# Patient Record
Sex: Male | Born: 1970
Health system: Southern US, Community
[De-identification: ages and names within clinical notes are randomized; demographics above are authoritative.]

## PROBLEM LIST (undated history)

## (undated) DIAGNOSIS — G4730 Sleep apnea, unspecified: Secondary | ICD-10-CM

## (undated) DIAGNOSIS — J189 Pneumonia, unspecified organism: Secondary | ICD-10-CM

## (undated) DIAGNOSIS — F1911 Other psychoactive substance abuse, in remission: Secondary | ICD-10-CM

## (undated) DIAGNOSIS — E785 Hyperlipidemia, unspecified: Secondary | ICD-10-CM

## (undated) DIAGNOSIS — F603 Borderline personality disorder: Secondary | ICD-10-CM

## (undated) DIAGNOSIS — I10 Essential (primary) hypertension: Secondary | ICD-10-CM

## (undated) DIAGNOSIS — E119 Type 2 diabetes mellitus without complications: Secondary | ICD-10-CM

## (undated) DIAGNOSIS — K219 Gastro-esophageal reflux disease without esophagitis: Secondary | ICD-10-CM

## (undated) DIAGNOSIS — E1165 Type 2 diabetes mellitus with hyperglycemia: Secondary | ICD-10-CM

## (undated) DIAGNOSIS — M1990 Unspecified osteoarthritis, unspecified site: Secondary | ICD-10-CM

## (undated) DIAGNOSIS — F319 Bipolar disorder, unspecified: Secondary | ICD-10-CM

## (undated) HISTORY — DX: Sleep apnea, unspecified: G47.30

## (undated) HISTORY — PX: WISDOM TOOTH EXTRACTION: SHX21

## (undated) HISTORY — DX: Bipolar disorder, unspecified: F31.9

## (undated) HISTORY — DX: Hyperlipidemia, unspecified: E78.5

## (undated) HISTORY — DX: Unspecified osteoarthritis, unspecified site: M19.90

---

## 2019-03-05 ENCOUNTER — Emergency Department (HOSPITAL_COMMUNITY)
Admission: EM | Admit: 2019-03-05 | Discharge: 2019-03-05 | Disposition: A | Discharge status: Home / Self Care | Payer: Medicare Other | Attending: Emergency Medicine | Admitting: Emergency Medicine

## 2019-03-05 ENCOUNTER — Other Ambulatory Visit: Payer: Self-pay

## 2019-03-05 ENCOUNTER — Encounter (HOSPITAL_COMMUNITY): Payer: Self-pay | Admitting: Emergency Medicine

## 2019-03-05 DIAGNOSIS — Z79899 Other long term (current) drug therapy: Secondary | ICD-10-CM | POA: Diagnosis not present

## 2019-03-05 DIAGNOSIS — I10 Essential (primary) hypertension: Secondary | ICD-10-CM | POA: Insufficient documentation

## 2019-03-05 DIAGNOSIS — E0865 Diabetes mellitus due to underlying condition with hyperglycemia: Secondary | ICD-10-CM

## 2019-03-05 DIAGNOSIS — F17210 Nicotine dependence, cigarettes, uncomplicated: Secondary | ICD-10-CM | POA: Insufficient documentation

## 2019-03-05 DIAGNOSIS — Z794 Long term (current) use of insulin: Secondary | ICD-10-CM

## 2019-03-05 DIAGNOSIS — F331 Major depressive disorder, recurrent, moderate: Secondary | ICD-10-CM | POA: Insufficient documentation

## 2019-03-05 DIAGNOSIS — E119 Type 2 diabetes mellitus without complications: Secondary | ICD-10-CM | POA: Diagnosis not present

## 2019-03-05 DIAGNOSIS — R45851 Suicidal ideations: Secondary | ICD-10-CM

## 2019-03-05 DIAGNOSIS — R4689 Other symptoms and signs involving appearance and behavior: Secondary | ICD-10-CM | POA: Diagnosis present

## 2019-03-05 DIAGNOSIS — F321 Major depressive disorder, single episode, moderate: Secondary | ICD-10-CM

## 2019-03-05 HISTORY — DX: Type 2 diabetes mellitus without complications: E11.9

## 2019-03-05 HISTORY — DX: Essential (primary) hypertension: I10

## 2019-03-05 LAB — RAPID URINE DRUG SCREEN, HOSP PERFORMED
Amphetamines: NOT DETECTED
Barbiturates: NOT DETECTED
Benzodiazepines: NOT DETECTED
Cocaine: NOT DETECTED
Opiates: NOT DETECTED
Tetrahydrocannabinol: NOT DETECTED

## 2019-03-05 LAB — CBC WITH DIFFERENTIAL/PLATELET
Abs Immature Granulocytes: 0.02 10*3/uL (ref 0.00–0.07)
Basophils Absolute: 0.1 10*3/uL (ref 0.0–0.1)
Basophils Relative: 1 %
Eosinophils Absolute: 0.2 10*3/uL (ref 0.0–0.5)
Eosinophils Relative: 2 %
HCT: 46.9 % (ref 39.0–52.0)
Hemoglobin: 15.7 g/dL (ref 13.0–17.0)
Immature Granulocytes: 0 %
Lymphocytes Relative: 35 %
Lymphs Abs: 3.1 10*3/uL (ref 0.7–4.0)
MCH: 30.5 pg (ref 26.0–34.0)
MCHC: 33.5 g/dL (ref 30.0–36.0)
MCV: 91.1 fL (ref 80.0–100.0)
Monocytes Absolute: 0.6 10*3/uL (ref 0.1–1.0)
Monocytes Relative: 7 %
Neutro Abs: 4.9 10*3/uL (ref 1.7–7.7)
Neutrophils Relative %: 55 %
Platelets: 181 10*3/uL (ref 150–400)
RBC: 5.15 MIL/uL (ref 4.22–5.81)
RDW: 12.1 % (ref 11.5–15.5)
WBC: 8.9 10*3/uL (ref 4.0–10.5)
nRBC: 0 % (ref 0.0–0.2)

## 2019-03-05 LAB — COMPREHENSIVE METABOLIC PANEL
ALT: 44 U/L (ref 0–44)
AST: 25 U/L (ref 15–41)
Albumin: 3.7 g/dL (ref 3.5–5.0)
Alkaline Phosphatase: 51 U/L (ref 38–126)
Anion gap: 9 (ref 5–15)
BUN: 16 mg/dL (ref 6–20)
CO2: 24 mmol/L (ref 22–32)
Calcium: 9.2 mg/dL (ref 8.9–10.3)
Chloride: 104 mmol/L (ref 98–111)
Creatinine, Ser: 0.6 mg/dL — ABNORMAL LOW (ref 0.61–1.24)
GFR calc Af Amer: >60 mL/min (ref >60)
GFR calc non Af Amer: >60 mL/min (ref >60)
Glucose, Bld: 424 mg/dL — ABNORMAL HIGH (ref 70–99)
Potassium: 4.4 mmol/L (ref 3.5–5.1)
Sodium: 137 mmol/L (ref 135–145)
Total Bilirubin: 0.4 mg/dL (ref 0.3–1.2)
Total Protein: 6.6 g/dL (ref 6.5–8.1)

## 2019-03-05 LAB — ETHANOL: Alcohol, Ethyl (B): <10 mg/dL (ref <10)

## 2019-03-05 LAB — ACETAMINOPHEN LEVEL: Acetaminophen (Tylenol), Serum: <10 ug/mL — ABNORMAL LOW (ref 10–30)

## 2019-03-05 LAB — SALICYLATE LEVEL: Salicylate Lvl: <7 mg/dL (ref 2.8–30.0)

## 2019-03-05 NOTE — ED Provider Notes (Signed)
[facility] EMERGENCY DEPARTMENT Provider Note   CSN: [id number] Arrival date & time: 03/05/19  8546    History   Chief Complaint Chief Complaint  Patient presents with  . V70.1    HPI Aaron Braun is a 48 y.o. male.     Patient presents with worsening aggression and suicidal ideation for the past 2 weeks.  Gradually worsening.  Not one specific trigger.  Patient has attempted suicide twice years ago.  [facility] has been [facility] however had a poor experience there.  Patient has not seen a behavioral health/psychiatrist in over 1 year.  Patient is not currently on medications however is been on multiple medications in the past.  Patient has high blood pressure history.  No fevers chills or other medical symptoms.  Patient has bipolar diagnosis in the past.     Past Medical History:  Diagnosis Date  . Diabetes mellitus without complication (HCC)   . Hypertension     There are no active problems to display for this patient.   History reviewed. No pertinent surgical history.      Home Medications    Prior to Admission medications   Not on File    Family History No family history on file.  Social History Social History   Tobacco Use  . Smoking status: Current Every Day Smoker    Packs/day: 1.00    Types: Cigarettes  . Smokeless tobacco: Never Used  Substance Use Topics  . Alcohol use: Never    Frequency: Never  . Drug use: Never     Allergies   Patient has no allergy information on record.   Review of Systems Review of Systems  Constitutional: Negative for chills and fever.  HENT: Negative for congestion.   Eyes: Negative for visual disturbance.  Respiratory: Negative for shortness of breath.   Cardiovascular: Negative for chest pain.  Gastrointestinal: Negative for abdominal pain and vomiting.  Genitourinary: Negative for dysuria and flank pain.  Musculoskeletal: Negative for back pain, neck pain and neck stiffness.  Skin: Negative for  rash.  Neurological: Negative for light-headedness and headaches.  Psychiatric/Behavioral: Positive for agitation, dysphoric mood and suicidal ideas.     Physical Exam Updated Vital Signs BP (!) 129/94 (BP Location: Left Arm)   Pulse (!) 101   Temp 98 F (36.7 C) (Oral)   Resp 19   Wt (!) 154.7 kg   SpO2 98%   Physical Exam Vitals signs and nursing note reviewed.  Constitutional:      Appearance: He is well-developed.  HENT:     Head: Normocephalic and atraumatic.  Eyes:     General:        Right eye: No discharge.        Left eye: No discharge.  Neck:     Musculoskeletal: Normal range of motion.     Trachea: No tracheal deviation.  Cardiovascular:     Rate and Rhythm: Normal rate.  Pulmonary:     Effort: Pulmonary effort is normal.  Abdominal:     General: There is no distension.     Tenderness: There is no guarding.  Skin:    General: Skin is warm.     Findings: No rash.  Neurological:     Mental Status: He is alert and oriented to person, place, and time.  Psychiatric:        Mood and Affect: Mood is depressed. Affect is not tearful.        Behavior: Behavior is not combative.  Thought Content: Thought content includes suicidal ideation. Thought content does not include suicidal plan.        Cognition and Memory: Cognition normal.        Judgment: Judgment is not impulsive.     Comments: cooperative      ED Treatments / Results  Labs (all labs ordered are listed, but only abnormal results are displayed) Labs Reviewed  COMPREHENSIVE METABOLIC PANEL - Abnormal; Notable for the following components:      Result Value   Glucose, Bld 424 (*)    Creatinine, Ser 0.60 (*)    All other components within normal limits  ACETAMINOPHEN LEVEL - Abnormal; Notable for the following components:   Acetaminophen (Tylenol), Serum <10 (*)    All other components within normal limits  ETHANOL  RAPID URINE DRUG SCREEN, HOSP PERFORMED  CBC WITH DIFFERENTIAL/PLATELET   SALICYLATE LEVEL    EKG None  Radiology No results found.  Procedures Procedures (including critical care time)  Medications Ordered in ED Medications - No data to display   Initial Impression / Assessment and Plan / ED Course  I have reviewed the triage vital signs and the nursing notes.  Pertinent labs & imaging results that were available during my care of the patient were reviewed by me and considered in my medical decision making (see chart for details).       Patient with history of depression, bipolar and suicide attempt presents with gradually worsening symptoms over 2 weeks.  Patient medically clear on my examination, blood work pending.  Patient voluntarily would like help to improve his symptoms.  Patient cooperative and stable in the ER.  Behavioral health recommended outpatient follow-up after full assessment.  Outpatient resources given.  Blood work reviewed unremarkable except elevated Glucose, normal gap.  Pt will need close outpatient follow up and medication management.  Discussed with his mother who is frustrated he did not meet inpatient criteria but will help direct him to pcp for glucose management and outpatient behavioral health.    Final Clinical Impressions(s) / ED Diagnoses   Final diagnoses:  Suicidal ideation  Current moderate episode of major depressive disorder, unspecified whether recurrent (HCC)  Diabetes mellitus due to underlying condition with hyperglycemia, with long-term current use of insulin [facility](HCC)    ED Discharge Orders    None       Blane OharaZavitz, Fillmore Bynum, MD 03/05/19 [phone]
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
## 2019-03-05 NOTE — ED Triage Notes (Signed)
Pt states he has been having suicidal thoughts for a couple weeks now. Pt denies plan.

## 2019-03-05 NOTE — Discharge Instructions (Signed)
Follow up as recommended by behavioral health. Return for worsening or persistent thoughts or plan of self harm.

## 2019-03-05 NOTE — BH Assessment (Signed)
Pt provided clinician his mother's name and phone number and gave verbal consent for clinician to contact his mother for collateral. The information gathered in that phone call can be found in the note [facility] Assessment dated and timed 04/29 4:51 AM.  Silvano Bilis, mother: [phone]

## 2019-08-01 LAB — HEMOGLOBIN A1C: Hemoglobin A1C: 11

## 2019-08-26 ENCOUNTER — Ambulatory Visit (INDEPENDENT_AMBULATORY_CARE_PROVIDER_SITE_OTHER): Payer: Medicare Other | Admitting: "Endocrinology

## 2019-08-26 ENCOUNTER — Other Ambulatory Visit: Payer: Self-pay

## 2019-08-26 ENCOUNTER — Encounter: Payer: Self-pay | Admitting: "Endocrinology

## 2019-08-26 VITALS — BP 139/87 | HR 106 | Ht 72.0 in | Wt 331.0 lb

## 2019-08-26 DIAGNOSIS — E782 Mixed hyperlipidemia: Secondary | ICD-10-CM | POA: Diagnosis not present

## 2019-08-26 DIAGNOSIS — E1165 Type 2 diabetes mellitus with hyperglycemia: Secondary | ICD-10-CM

## 2019-08-26 DIAGNOSIS — I10 Essential (primary) hypertension: Secondary | ICD-10-CM | POA: Insufficient documentation

## 2019-08-26 MED ORDER — ACCU-CHEK GUIDE VI STRP: ORAL_STRIP | 2 refills | Status: DC

## 2019-08-26 MED ORDER — SITAGLIPTIN PHOSPHATE 50 MG PO TABS: 50.0000 mg | ORAL_TABLET | Freq: Every day | ORAL | 3 refills | Status: DC

## 2019-08-26 MED ORDER — LEVEMIR FLEXTOUCH 100 UNIT/ML ~~LOC~~ SOPN: 60.0000 [IU] | PEN_INJECTOR | Freq: Every day | SUBCUTANEOUS | 2 refills | Status: DC

## 2019-08-26 MED ORDER — PEN NEEDLES 31G X 8 MM MISC: 1.0000 | Freq: Every day | 0 refills | Status: AC

## 2019-08-26 NOTE — Progress Notes (Signed)
Endocrinology Consult Note       08/26/2019, 5:55 PM   Subjective:    Patient ID: Aaron Braun, male    DOB: 1971-09-22.  Aaron Braun is being seen in consultation for management of currently uncontrolled symptomatic diabetes requested by  Gaspar Skeeters, MD.   Past Medical History:  Diagnosis Date  . Diabetes mellitus without complication (HCC)   . Hypertension     History reviewed. No pertinent surgical history.  Social History   Socioeconomic History  . Marital status: Single    Spouse name: Not on file  . Number of children: Not on file  . Years of education: Not on file  . Highest education level: Not on file  Occupational History  . Not on file  Social Needs  . Financial resource strain: Not on file  . Food insecurity    Worry: Not on file    Inability: Not on file  . Transportation needs    Medical: Not on file    Non-medical: Not on file  Tobacco Use  . Smoking status: Current Every Day Smoker    Packs/day: 1.00    Years: 35.00    Pack years: 35.00    Types: Cigarettes  . Smokeless tobacco: Never Used  Substance and Sexual Activity  . Alcohol use: Never    Frequency: Never  . Drug use: Never  . Sexual activity: Not on file  Lifestyle  . Physical activity    Days per week: Not on file    Minutes per session: Not on file  . Stress: Not on file  Relationships  . Social Musician on phone: Not on file    Gets together: Not on file    Attends religious service: Not on file    Active member of club or organization: Not on file    Attends meetings of clubs or organizations: Not on file    Relationship status: Not on file  Other Topics Concern  . Not on file  Social History Narrative  . Not on file    Family History  Problem Relation Age of Onset  . Diabetes Mother   . Thyroid disease Mother   . Hypertension Mother   . Hyperlipidemia  Mother   . CAD Mother   . Kidney disease Mother   . Stroke Mother   . Heart attack Mother   . Diabetes Father   . Hypertension Father   . Hyperlipidemia Father   . Heart attack Father   . CAD Father     Outpatient Encounter Medications as of 08/26/2019  Medication Sig  . atorvastatin (LIPITOR) 40 MG tablet Take by mouth daily.  Marland Kitchen lisinopril-hydrochlorothiazide (ZESTORETIC) 20-12.5 MG tablet Take by mouth daily.  . [DISCONTINUED] Insulin Detemir (LEVEMIR FLEXTOUCH) 100 UNIT/ML Pen Inject 60 Units into the skin at bedtime.  . [DISCONTINUED] Insulin Detemir (LEVEMIR FLEXTOUCH) 100 UNIT/ML Pen Inject 60 Units into the skin at bedtime.  . [DISCONTINUED] liraglutide (VICTOZA) 18 MG/3ML SOPN 0.6 mg daily.  . [DISCONTINUED] sitaGLIPtin (JANUVIA) 50 MG tablet Take by mouth daily.  . [DISCONTINUED] sitaGLIPtin (JANUVIA) 50 MG tablet Take 1  tablet (50 mg total) by mouth daily.  Marland Kitchen glucose blood (ACCU-CHEK GUIDE) test strip Use as instructed   No facility-administered encounter medications on file as of 08/26/2019.     ALLERGIES: Allergies  Allergen Reactions  . Other     Mushrooms    VACCINATION STATUS:  There is no immunization history on file for this patient.  Diabetes He presents for his initial diabetic visit. He has type 2 diabetes mellitus. Onset time: He was diagnosed at approximate age of 48 years.  He admits that he was an alcoholic, clean for more than 5 years. His disease course has been worsening. There are no hypoglycemic associated symptoms. Pertinent negatives for hypoglycemia include no headaches, seizures or tremors. Associated symptoms include polydipsia and polyuria. Pertinent negatives for diabetes include no chest pain. There are no hypoglycemic complications. Symptoms are worsening. There are no diabetic complications. Risk factors for coronary artery disease include diabetes mellitus, dyslipidemia, hypertension, family history, obesity, male sex, tobacco exposure and  sedentary lifestyle. Current diabetic treatment includes insulin injections (He is currently on Levemir 50 units nightly, Victoza 0.6 mg subcutaneously daily, Januvia 50 mg p.o. daily.  He does not tolerate metformin.). His weight is decreasing steadily. He is following a generally unhealthy diet. When asked about meal planning, he reported none. He has not had a previous visit with a dietitian. He participates in exercise intermittently. (He did not bring any logs nor meter to review.  He is recent A1c of 11% on August 01, 2019.) An ACE inhibitor/angiotensin II receptor blocker is being taken. Eye exam is current.  Hyperlipidemia This is a chronic problem. The current episode started more than 1 year ago. The problem is uncontrolled. Exacerbating diseases include diabetes and obesity. Pertinent negatives include no chest pain, myalgias or shortness of breath. Current antihyperlipidemic treatment includes statins. Risk factors for coronary artery disease include dyslipidemia, diabetes mellitus, a sedentary lifestyle, hypertension, male sex and family history.  Hypertension This is a chronic problem. The current episode started more than 1 year ago. The problem is controlled. Pertinent negatives include no chest pain, headaches, palpitations or shortness of breath. Risk factors for coronary artery disease include dyslipidemia, diabetes mellitus, male gender, obesity, sedentary lifestyle, smoking/tobacco exposure and family history. Past treatments include ACE inhibitors.     Review of Systems  Constitutional: Negative for chills and fever.  Respiratory: Negative for cough and shortness of breath.   Cardiovascular: Negative for chest pain and palpitations.       No Shortness of breath  Gastrointestinal: Negative for abdominal pain, diarrhea, nausea and vomiting.  Endocrine: Positive for polydipsia and polyuria.  Genitourinary: Negative for frequency, hematuria and urgency.  Musculoskeletal: Negative  for myalgias.  Skin: Negative for rash.  Neurological: Negative for tremors, seizures and headaches.  Hematological: Does not bruise/bleed easily.  Psychiatric/Behavioral: Negative for hallucinations and suicidal ideas.    Objective:    BP 139/87   Pulse (!) 106   Ht 6' (1.829 m)   Wt (!) 331 lb (150.1 kg)   BMI 44.89 kg/m   Wt Readings from Last 3 Encounters:  08/26/19 (!) 331 lb (150.1 kg)  03/05/19 (!) 341 lb (154.7 kg)     Physical Exam Constitutional:      General: He is not in acute distress.    Appearance: He is well-developed.  HENT:     Head: Normocephalic and atraumatic.  Neck:     Musculoskeletal: Normal range of motion and neck supple.     Thyroid:  No thyromegaly.     Trachea: No tracheal deviation.  Cardiovascular:     Rate and Rhythm: Normal rate.     Pulses:          Dorsalis pedis pulses are 1+ on the right side and 1+ on the left side.       Posterior tibial pulses are 1+ on the right side and 1+ on the left side.     Heart sounds: Normal heart sounds, S1 normal and S2 normal. No murmur. No gallop.   Pulmonary:     Effort: Pulmonary effort is normal. No respiratory distress.     Breath sounds: No wheezing.  Abdominal:     General: There is no distension.     Tenderness: There is no abdominal tenderness. There is no guarding.  Musculoskeletal:     Right shoulder: He exhibits no swelling and no deformity.  Skin:    General: Skin is warm and dry.     Findings: No rash.     Nails: There is no clubbing.   Neurological:     Mental Status: He is alert and oriented to person, place, and time.     Cranial Nerves: No cranial nerve deficit.     Sensory: No sensory deficit.     Gait: Gait normal.     Deep Tendon Reflexes: Reflexes are normal and symmetric.  Psychiatric:        Speech: Speech normal.        Behavior: Behavior normal. Behavior is cooperative.        Thought Content: Thought content normal.        Judgment: Judgment normal.       CMP  ( most recent) CMP     Component Value Date/Time   NA 137 03/05/2019 0402   K 4.4 03/05/2019 0402   CL 104 03/05/2019 0402   CO2 24 03/05/2019 0402   GLUCOSE 424 (H) 03/05/2019 0402   BUN 16 03/05/2019 0402   CREATININE 0.60 (L) 03/05/2019 0402   CALCIUM 9.2 03/05/2019 0402   PROT 6.6 03/05/2019 0402   ALBUMIN 3.7 03/05/2019 0402   AST 25 03/05/2019 0402   ALT 44 03/05/2019 0402   ALKPHOS 51 03/05/2019 0402   BILITOT 0.4 03/05/2019 0402   GFRNONAA >60 03/05/2019 0402   GFRAA >60 03/05/2019 0402     Diabetic Labs (most recent): Lab Results  Component Value Date   HGBA1C 11 08/01/2019       Assessment & Plan:   1. Uncontrolled type 2 diabetes mellitus with hyperglycemia ([place])  - Aaron Braun has currently uncontrolled symptomatic type 2 DM since  48 years of age,  with most recent A1c of 11 %. Recent labs reviewed. - I had a long discussion with him about the progressive nature of diabetes and the pathology behind its complications. -his diabetes is complicated by obesity, chronic smoking and he remains at a high risk for more acute and chronic complications which include CAD, CVA, CKD, retinopathy, and neuropathy. These are all discussed in detail with him.  - I have counseled him on diet  and weight management  by adopting a carbohydrate restricted/protein rich diet. Patient is encouraged to switch to  unprocessed or minimally processed     complex starch and increased protein intake (animal or plant source), fruits, and vegetables. -  he is advised to stick to a routine mealtimes to eat 3 meals  a day and avoid unnecessary snacks ( to snack only to correct hypoglycemia).   -  he admits that there is a room for improvement in his food and drink choices. - Suggestion is made for him to avoid simple carbohydrates  from his diet including Cakes, Sweet Desserts, Ice Cream, Soda (diet and regular), Sweet Tea, Candies, Chips, Cookies, Store Bought Juices, Alcohol in Excess  of  1-2 drinks a day, Artificial Sweeteners,  Coffee Creamer, and "Sugar-free" Products. This will help patient to have more stable blood glucose profile and potentially avoid unintended weight gain.  - he will be scheduled with Norm Salt, RDN, CDE for diabetes education.  - I have approached him with the following individualized plan to manage  his diabetes and patient agrees:   -Given his prevailing glycemic burden, he will likely require intensive treatment with basal/bolus insulin in order for him to achieve and maintain control of diabetes to target. In preparation, he is advised to increase his Levemir to 60 units nightly, start strict monitoring of glucose 4 times a day-before meals and at bedtime, and return in 10 days for reassessment. -He will be considered for prandial insulin if his postprandial glycemic profile is significantly above target. - he is warned not to take insulin without proper monitoring per orders.  - he is encouraged to call clinic for blood glucose levels less than 70 or above 200 mg /dl. -He has history of alcoholism, incretin therapy potentially can trigger pancreatitis.  He is advised to discontinue the Victoza.  For now, he is advised to continue Januvia 50 mg p.o. daily at breakfast.   -He does not tolerate metformin.  He has an option of low-dose glipizide therapy, will be considered as needed. - Specific targets for  A1c;  LDL, HDL, Triglycerides, and  Waist Circumference were discussed with the patient.  2) Blood Pressure /Hypertension:  his blood pressure is  controlled to target.   he is advised to continue his current medications including lisinopril 20/HCTZ 12.5 mg p.o. daily with breakfast . 3) Lipids/Hyperlipidemia: He does not have recent lipid panel to review.  He is advised to continue atorvastatin 40 mg p.o. nightly.   Side effects and precautions discussed with him.  4)  Weight/Diet:  Body mass index is 44.89 kg/m.  -   clearly complicating  his diabetes care.   he is  a candidate for weight loss.  He reports that he weighed up to 600 pounds historically.  I discussed with him the fact that loss of 5 - 10% of his  current body weight will have the most impact on his diabetes management.  Exercise, and detailed carbohydrates information provided  -  detailed on discharge instructions.  5) Chronic Care/Health Maintenance:  -he  is on ACEI/ARB and Statin medications and  is encouraged to initiate and continue to follow up with Ophthalmology, Dentist,  Podiatrist at least yearly or according to recommendations, and advised to  Quit smoking.  Is counseled for smoking cessation.  I have recommended yearly flu vaccine and pneumonia vaccine at least every 5 years; moderate intensity exercise for up to 150 minutes weekly; and  sleep for at least 7 hours a day.  - he is  advised to maintain close follow up with Gaspar Skeeters, MD for primary care needs, as well as his other providers for optimal and coordinated care.  - Time spent with the patient: 45 minutes, of which >50% was spent in obtaining information about his symptoms, reviewing his previous labs/studies, evaluations, and treatments, counseling him about his currently uncontrolled type 2 diabetes, hyperlipidemia, hypertension,  and developing plans for long term treatment based on the latest standards of care/guidelines.  Please refer to " Patient Self Inventory" in the Media  tab for reviewed elements of pertinent patient history.  Aaron BooksKenneth Braun participated in the discussions, expressed understanding, and voiced agreement with the above plans.  All questions were answered to his satisfaction. he is encouraged to contact clinic should he have any questions or concerns prior to his return visit.  Follow up plan: - Return in about 10 days (around 09/05/2019), or phone, for Follow up with Meter and Logs Only - no Labs.  Marquis LunchGebre , MD [facility] [address] [place], [place] [address] Phone: [phone]  Fax: [phone]    08/26/2019, 5:55 PM  This note was partially dictated with voice recognition software. Similar sounding words can be transcribed inadequately or may not  be corrected upon review.

## 2019-08-26 NOTE — Patient Instructions (Signed)

## 2019-09-04 ENCOUNTER — Ambulatory Visit: Payer: Medicare Other | Admitting: "Endocrinology

## 2019-09-04 ENCOUNTER — Other Ambulatory Visit: Payer: Self-pay

## 2019-09-08 ENCOUNTER — Ambulatory Visit: Payer: Medicare Other | Admitting: "Endocrinology

## 2019-09-15 ENCOUNTER — Ambulatory Visit: Payer: Medicare Other | Admitting: "Endocrinology

## 2019-09-15 ENCOUNTER — Other Ambulatory Visit: Payer: Self-pay

## 2019-10-08 ENCOUNTER — Other Ambulatory Visit: Payer: Self-pay

## 2019-10-08 ENCOUNTER — Encounter: Payer: Medicare Other | Attending: Family Medicine | Admitting: Nutrition

## 2019-10-08 ENCOUNTER — Encounter: Payer: Self-pay | Admitting: Nutrition

## 2019-10-08 VITALS — Ht 72.0 in | Wt 331.0 lb

## 2019-10-08 DIAGNOSIS — E782 Mixed hyperlipidemia: Secondary | ICD-10-CM | POA: Diagnosis present

## 2019-10-08 DIAGNOSIS — E1165 Type 2 diabetes mellitus with hyperglycemia: Secondary | ICD-10-CM | POA: Diagnosis present

## 2019-10-08 DIAGNOSIS — I10 Essential (primary) hypertension: Secondary | ICD-10-CM | POA: Insufficient documentation

## 2019-10-08 MED ORDER — ACCU-CHEK GUIDE VI STRP: ORAL_STRIP | 5 refills | Status: AC

## 2019-10-08 MED ORDER — ACCU-CHEK GUIDE W/DEVICE KIT: 1.0000 | PACK | Freq: Four times a day (QID) | 0 refills | Status: AC

## 2019-10-08 NOTE — Patient Instructions (Signed)
Goals Follow My Plate Eat 4 CHO choices per meal Eat meals on time Don't skip meals Increase protein, vegetables and fresh fruit Drink only water Walk 30 minutes a day aside from work. Pick up Accucheck meter and start testing twice a day  Goal BS before breakfast: less than 150 and bedtime less than 180 mg/dl. Lose 1-2 lbs per week Prepare meals at home and take lunch to work instead of skipping it.. Get A1C down to 7

## 2019-10-08 NOTE — Progress Notes (Signed)
Medical Nutrition Therapy:  Appt start time: 0800 end time:  0900.   Assessment:  Primary concerns today: Diabetes Type 2, morbid obesity. Has lost 300 lbs  Since 2017. He is engaged. He cooks and shops.  Hasn't been testing because he doesn't have a meter. 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 Use to sit on couch a lot. Now works for [employer] and is much more active. Doesn't like vegetables-will only eat cooked carrots and green beans. Eats randomly skipping meals frequently .  Willing to work on changing eating habits and increase physical activity for further weight loss and improved Dm and reduce complications. He grew up in [place] and is use to eating lots of pizza's and subs and high fat high salt foods. He is trying to eat better. Drinking water now mosly  See Dr. Fransico Him, Endocrinology..  Levemeir 60 units. Januvia daily. . Will request to get a prescription for new meter to start testing again.  Lab Results  Component Value Date   HGBA1C 11 08/01/2019   CMP Latest Ref Rng & Units 03/05/2019  Glucose 70 - 99 mg/dL 287(O)  BUN 6 - 20 mg/dL 16  Creatinine 6.76 - 7.20 mg/dL 9.47(S)  Sodium 962 - 836 mmol/L 137  Potassium 3.5 - 5.1 mmol/L 4.4  Chloride 98 - 111 mmol/L 104  CO2 22 - 32 mmol/L 24  Calcium 8.9 - 10.3 mg/dL 9.2  Total Protein 6.5 - 8.1 g/dL 6.6  Total Bilirubin 0.3 - 1.2 mg/dL 0.4  Alkaline Phos 38 - 126 U/L 51  AST 15 - 41 U/L 25  ALT 0 - 44 U/L 44  Lipid Panel  No results found for: CHOL, TRIG, HDL, CHOLHDL, VLDL, LDLCALC, LDLDIRECT, LABVLDL  Preferred Learning Style:   No preference indicated   Learning Readiness:  Ready  Change in progress   MEDICATIONS:    DIETARY INTAKE:    24-hr recall:  Sausage biscuit  and Dt. Dr. Reino Kent, 3 slices thin pizza,  Water 2 Bologna sandwiches, water Water: gallon a day.  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   Nutritional Diagnosis:  NB-1.1 Food and nutrition-related knowledge deficit As related to Diabetes Type 2.  As evidenced by A1C 11%.    Intervention:  Nutrition and Diabetes education provided on My Plate, CHO counting, meal planning, portion sizes, timing of meals, avoiding snacks between meals unless having a low blood sugar, target ranges for A1C and blood sugars, signs/symptoms and treatment of hyper/hypoglycemia, monitoring blood sugars, taking medications as prescribed, benefits of exercising 30 minutes per day and prevention of complications of DM.  Goals Follow My Plate Eat 4 CHO choices per meal Eat meals on time Don't skip meals Increase protein, vegetables and fresh fruit Drink only water Walk 30 minutes a day aside from work. Pick up Accucheck meter and start testing twice a day  Goal BS before breakfast: less than 150 and bedtime less than 180 mg/dl. Lose 1-2 lbs per week Prepare meals at home and take lunch to work instead of skipping it.. Get A1C down to 7%  Teaching Method Utilized:  Visual Auditory Hands on  Handouts given during visit include:  The Plate Method  Meal Plan  Diabetes Instructions.   Weight loss tips   Barriers to learning/adherence  to lifestyle change: none  Demonstrated degree of understanding via:  Teach Back   Monitoring/Evaluation:  Dietary intake, exercise, , and body weight in 1 month(s).

## 2019-11-13 ENCOUNTER — Ambulatory Visit: Payer: Medicare Other | Attending: Internal Medicine

## 2019-11-13 DIAGNOSIS — Z20822 Contact with and (suspected) exposure to covid-19: Secondary | ICD-10-CM

## 2019-11-15 LAB — NOVEL CORONAVIRUS, NAA: SARS-CoV-2, NAA: NOT DETECTED

## 2019-11-16 ENCOUNTER — Telehealth: Payer: Self-pay

## 2019-11-16 NOTE — Telephone Encounter (Signed)
Patient called in and received his negative covid test result  °

## 2019-11-20 ENCOUNTER — Ambulatory Visit: Payer: Medicare Other | Admitting: Nutrition

## 2019-11-26 ENCOUNTER — Ambulatory Visit: Payer: Self-pay | Attending: Internal Medicine

## 2019-11-26 ENCOUNTER — Other Ambulatory Visit: Payer: Medicare Other

## 2019-11-26 DIAGNOSIS — Z20822 Contact with and (suspected) exposure to covid-19: Secondary | ICD-10-CM | POA: Insufficient documentation

## 2019-11-27 LAB — NOVEL CORONAVIRUS, NAA: SARS-CoV-2, NAA: NOT DETECTED

## 2019-12-10 DIAGNOSIS — F4312 Post-traumatic stress disorder, chronic: Secondary | ICD-10-CM | POA: Diagnosis not present

## 2019-12-10 DIAGNOSIS — F315 Bipolar disorder, current episode depressed, severe, with psychotic features: Secondary | ICD-10-CM | POA: Diagnosis not present

## 2019-12-17 DIAGNOSIS — Z6841 Body Mass Index (BMI) 40.0 and over, adult: Secondary | ICD-10-CM | POA: Diagnosis not present

## 2019-12-17 DIAGNOSIS — F4312 Post-traumatic stress disorder, chronic: Secondary | ICD-10-CM | POA: Diagnosis not present

## 2019-12-17 DIAGNOSIS — E11319 Type 2 diabetes mellitus with unspecified diabetic retinopathy without macular edema: Secondary | ICD-10-CM | POA: Diagnosis not present

## 2019-12-17 DIAGNOSIS — F315 Bipolar disorder, current episode depressed, severe, with psychotic features: Secondary | ICD-10-CM | POA: Diagnosis not present

## 2019-12-17 DIAGNOSIS — R05 Cough: Secondary | ICD-10-CM | POA: Diagnosis not present

## 2019-12-17 DIAGNOSIS — F17210 Nicotine dependence, cigarettes, uncomplicated: Secondary | ICD-10-CM | POA: Diagnosis not present

## 2019-12-17 DIAGNOSIS — E785 Hyperlipidemia, unspecified: Secondary | ICD-10-CM | POA: Diagnosis not present

## 2019-12-17 DIAGNOSIS — E1165 Type 2 diabetes mellitus with hyperglycemia: Secondary | ICD-10-CM | POA: Diagnosis not present

## 2019-12-17 DIAGNOSIS — I10 Essential (primary) hypertension: Secondary | ICD-10-CM | POA: Diagnosis not present

## 2019-12-17 DIAGNOSIS — Z794 Long term (current) use of insulin: Secondary | ICD-10-CM | POA: Diagnosis not present

## 2019-12-29 ENCOUNTER — Encounter (INDEPENDENT_AMBULATORY_CARE_PROVIDER_SITE_OTHER): Payer: Self-pay

## 2019-12-29 ENCOUNTER — Encounter (INDEPENDENT_AMBULATORY_CARE_PROVIDER_SITE_OTHER): Payer: Medicare HMO | Admitting: Ophthalmology

## 2020-01-01 DIAGNOSIS — F4312 Post-traumatic stress disorder, chronic: Secondary | ICD-10-CM | POA: Diagnosis not present

## 2020-01-01 DIAGNOSIS — F315 Bipolar disorder, current episode depressed, severe, with psychotic features: Secondary | ICD-10-CM | POA: Diagnosis not present

## 2020-01-12 DIAGNOSIS — I10 Essential (primary) hypertension: Secondary | ICD-10-CM | POA: Diagnosis not present

## 2020-01-12 DIAGNOSIS — E1165 Type 2 diabetes mellitus with hyperglycemia: Secondary | ICD-10-CM | POA: Diagnosis not present

## 2020-01-12 DIAGNOSIS — E782 Mixed hyperlipidemia: Secondary | ICD-10-CM | POA: Diagnosis not present

## 2020-01-12 DIAGNOSIS — Z6841 Body Mass Index (BMI) 40.0 and over, adult: Secondary | ICD-10-CM | POA: Diagnosis not present

## 2020-01-12 DIAGNOSIS — F17200 Nicotine dependence, unspecified, uncomplicated: Secondary | ICD-10-CM | POA: Diagnosis not present

## 2020-01-15 DIAGNOSIS — M9901 Segmental and somatic dysfunction of cervical region: Secondary | ICD-10-CM | POA: Diagnosis not present

## 2020-01-15 DIAGNOSIS — M9903 Segmental and somatic dysfunction of lumbar region: Secondary | ICD-10-CM | POA: Diagnosis not present

## 2020-01-15 DIAGNOSIS — M5412 Radiculopathy, cervical region: Secondary | ICD-10-CM | POA: Diagnosis not present

## 2020-01-15 DIAGNOSIS — M5033 Other cervical disc degeneration, cervicothoracic region: Secondary | ICD-10-CM | POA: Diagnosis not present

## 2020-01-16 DIAGNOSIS — M5412 Radiculopathy, cervical region: Secondary | ICD-10-CM | POA: Diagnosis not present

## 2020-01-16 DIAGNOSIS — M9901 Segmental and somatic dysfunction of cervical region: Secondary | ICD-10-CM | POA: Diagnosis not present

## 2020-01-16 DIAGNOSIS — M5033 Other cervical disc degeneration, cervicothoracic region: Secondary | ICD-10-CM | POA: Diagnosis not present

## 2020-01-16 DIAGNOSIS — M9903 Segmental and somatic dysfunction of lumbar region: Secondary | ICD-10-CM | POA: Diagnosis not present

## 2020-01-19 DIAGNOSIS — M9901 Segmental and somatic dysfunction of cervical region: Secondary | ICD-10-CM | POA: Diagnosis not present

## 2020-01-19 DIAGNOSIS — M5033 Other cervical disc degeneration, cervicothoracic region: Secondary | ICD-10-CM | POA: Diagnosis not present

## 2020-01-19 DIAGNOSIS — M5412 Radiculopathy, cervical region: Secondary | ICD-10-CM | POA: Diagnosis not present

## 2020-01-19 DIAGNOSIS — M9903 Segmental and somatic dysfunction of lumbar region: Secondary | ICD-10-CM | POA: Diagnosis not present

## 2020-01-21 DIAGNOSIS — M9901 Segmental and somatic dysfunction of cervical region: Secondary | ICD-10-CM | POA: Diagnosis not present

## 2020-01-21 DIAGNOSIS — M5033 Other cervical disc degeneration, cervicothoracic region: Secondary | ICD-10-CM | POA: Diagnosis not present

## 2020-01-21 DIAGNOSIS — M9903 Segmental and somatic dysfunction of lumbar region: Secondary | ICD-10-CM | POA: Diagnosis not present

## 2020-01-21 DIAGNOSIS — M5412 Radiculopathy, cervical region: Secondary | ICD-10-CM | POA: Diagnosis not present

## 2020-01-22 DIAGNOSIS — H524 Presbyopia: Secondary | ICD-10-CM | POA: Diagnosis not present

## 2020-01-22 DIAGNOSIS — F315 Bipolar disorder, current episode depressed, severe, with psychotic features: Secondary | ICD-10-CM | POA: Diagnosis not present

## 2020-01-22 DIAGNOSIS — H52209 Unspecified astigmatism, unspecified eye: Secondary | ICD-10-CM | POA: Diagnosis not present

## 2020-01-22 DIAGNOSIS — F4312 Post-traumatic stress disorder, chronic: Secondary | ICD-10-CM | POA: Diagnosis not present

## 2020-01-22 DIAGNOSIS — H5203 Hypermetropia, bilateral: Secondary | ICD-10-CM | POA: Diagnosis not present

## 2020-02-04 DIAGNOSIS — I10 Essential (primary) hypertension: Secondary | ICD-10-CM | POA: Diagnosis not present

## 2020-02-04 DIAGNOSIS — F17210 Nicotine dependence, cigarettes, uncomplicated: Secondary | ICD-10-CM | POA: Diagnosis not present

## 2020-02-04 DIAGNOSIS — Z6841 Body Mass Index (BMI) 40.0 and over, adult: Secondary | ICD-10-CM | POA: Diagnosis not present

## 2020-02-04 DIAGNOSIS — R05 Cough: Secondary | ICD-10-CM | POA: Diagnosis not present

## 2020-02-12 DIAGNOSIS — F315 Bipolar disorder, current episode depressed, severe, with psychotic features: Secondary | ICD-10-CM | POA: Diagnosis not present

## 2020-02-12 DIAGNOSIS — F4312 Post-traumatic stress disorder, chronic: Secondary | ICD-10-CM | POA: Diagnosis not present

## 2020-02-19 DIAGNOSIS — F4312 Post-traumatic stress disorder, chronic: Secondary | ICD-10-CM | POA: Diagnosis not present

## 2020-02-19 DIAGNOSIS — F315 Bipolar disorder, current episode depressed, severe, with psychotic features: Secondary | ICD-10-CM | POA: Diagnosis not present

## 2020-03-04 DIAGNOSIS — F315 Bipolar disorder, current episode depressed, severe, with psychotic features: Secondary | ICD-10-CM | POA: Diagnosis not present

## 2020-03-04 DIAGNOSIS — F4312 Post-traumatic stress disorder, chronic: Secondary | ICD-10-CM | POA: Diagnosis not present

## 2020-03-11 DIAGNOSIS — F315 Bipolar disorder, current episode depressed, severe, with psychotic features: Secondary | ICD-10-CM | POA: Diagnosis not present

## 2020-03-11 DIAGNOSIS — F4312 Post-traumatic stress disorder, chronic: Secondary | ICD-10-CM | POA: Diagnosis not present

## 2020-03-15 DIAGNOSIS — M25562 Pain in left knee: Secondary | ICD-10-CM | POA: Diagnosis not present

## 2020-03-15 DIAGNOSIS — Z794 Long term (current) use of insulin: Secondary | ICD-10-CM | POA: Diagnosis not present

## 2020-03-15 DIAGNOSIS — E119 Type 2 diabetes mellitus without complications: Secondary | ICD-10-CM | POA: Diagnosis not present

## 2020-03-15 DIAGNOSIS — R05 Cough: Secondary | ICD-10-CM | POA: Diagnosis not present

## 2020-03-15 DIAGNOSIS — I10 Essential (primary) hypertension: Secondary | ICD-10-CM | POA: Diagnosis not present

## 2020-03-15 DIAGNOSIS — G8929 Other chronic pain: Secondary | ICD-10-CM | POA: Diagnosis not present

## 2020-03-15 DIAGNOSIS — Z6841 Body Mass Index (BMI) 40.0 and over, adult: Secondary | ICD-10-CM | POA: Diagnosis not present

## 2020-03-15 DIAGNOSIS — E785 Hyperlipidemia, unspecified: Secondary | ICD-10-CM | POA: Diagnosis not present

## 2020-03-19 DIAGNOSIS — F315 Bipolar disorder, current episode depressed, severe, with psychotic features: Secondary | ICD-10-CM | POA: Diagnosis not present

## 2020-03-19 DIAGNOSIS — F4312 Post-traumatic stress disorder, chronic: Secondary | ICD-10-CM | POA: Diagnosis not present

## 2020-03-30 DIAGNOSIS — M25562 Pain in left knee: Secondary | ICD-10-CM | POA: Diagnosis not present

## 2020-03-30 DIAGNOSIS — G8929 Other chronic pain: Secondary | ICD-10-CM | POA: Diagnosis not present

## 2020-03-30 DIAGNOSIS — M1732 Unilateral post-traumatic osteoarthritis, left knee: Secondary | ICD-10-CM | POA: Diagnosis not present

## 2020-03-30 DIAGNOSIS — M2392 Unspecified internal derangement of left knee: Secondary | ICD-10-CM | POA: Diagnosis not present

## 2020-04-07 ENCOUNTER — Encounter (INDEPENDENT_AMBULATORY_CARE_PROVIDER_SITE_OTHER): Payer: Medicare HMO | Admitting: Ophthalmology

## 2020-04-09 DIAGNOSIS — F4312 Post-traumatic stress disorder, chronic: Secondary | ICD-10-CM | POA: Diagnosis not present

## 2020-04-09 DIAGNOSIS — F315 Bipolar disorder, current episode depressed, severe, with psychotic features: Secondary | ICD-10-CM | POA: Diagnosis not present

## 2020-04-13 DIAGNOSIS — M1732 Unilateral post-traumatic osteoarthritis, left knee: Secondary | ICD-10-CM | POA: Diagnosis not present

## 2020-04-13 DIAGNOSIS — M25562 Pain in left knee: Secondary | ICD-10-CM | POA: Diagnosis not present

## 2020-04-13 DIAGNOSIS — G8929 Other chronic pain: Secondary | ICD-10-CM | POA: Diagnosis not present

## 2020-04-20 DIAGNOSIS — M1732 Unilateral post-traumatic osteoarthritis, left knee: Secondary | ICD-10-CM | POA: Diagnosis not present

## 2020-04-20 DIAGNOSIS — M2392 Unspecified internal derangement of left knee: Secondary | ICD-10-CM | POA: Diagnosis not present

## 2020-04-20 DIAGNOSIS — M25562 Pain in left knee: Secondary | ICD-10-CM | POA: Diagnosis not present

## 2020-04-28 DIAGNOSIS — K047 Periapical abscess without sinus: Secondary | ICD-10-CM | POA: Diagnosis not present

## 2020-04-28 DIAGNOSIS — K0889 Other specified disorders of teeth and supporting structures: Secondary | ICD-10-CM | POA: Diagnosis not present

## 2020-05-11 ENCOUNTER — Other Ambulatory Visit: Payer: Self-pay

## 2020-05-11 ENCOUNTER — Emergency Department
Admission: EM | Admit: 2020-05-11 | Discharge: 2020-05-12 | Disposition: A | Discharge status: Home / Self Care | Payer: Medicare HMO | Attending: Emergency Medicine | Admitting: Emergency Medicine

## 2020-05-11 ENCOUNTER — Encounter: Payer: Self-pay | Admitting: Emergency Medicine

## 2020-05-11 DIAGNOSIS — Z03818 Encounter for observation for suspected exposure to other biological agents ruled out: Secondary | ICD-10-CM | POA: Diagnosis not present

## 2020-05-11 DIAGNOSIS — F17210 Nicotine dependence, cigarettes, uncomplicated: Secondary | ICD-10-CM | POA: Diagnosis not present

## 2020-05-11 DIAGNOSIS — R259 Unspecified abnormal involuntary movements: Secondary | ICD-10-CM | POA: Diagnosis not present

## 2020-05-11 DIAGNOSIS — F603 Borderline personality disorder: Secondary | ICD-10-CM | POA: Diagnosis not present

## 2020-05-11 DIAGNOSIS — F419 Anxiety disorder, unspecified: Secondary | ICD-10-CM | POA: Diagnosis not present

## 2020-05-11 DIAGNOSIS — I10 Essential (primary) hypertension: Secondary | ICD-10-CM | POA: Insufficient documentation

## 2020-05-11 DIAGNOSIS — R45851 Suicidal ideations: Secondary | ICD-10-CM | POA: Insufficient documentation

## 2020-05-11 DIAGNOSIS — Z046 Encounter for general psychiatric examination, requested by authority: Secondary | ICD-10-CM

## 2020-05-11 DIAGNOSIS — Z7984 Long term (current) use of oral hypoglycemic drugs: Secondary | ICD-10-CM | POA: Diagnosis not present

## 2020-05-11 DIAGNOSIS — F3130 Bipolar disorder, current episode depressed, mild or moderate severity, unspecified: Secondary | ICD-10-CM | POA: Diagnosis not present

## 2020-05-11 DIAGNOSIS — Z20822 Contact with and (suspected) exposure to covid-19: Secondary | ICD-10-CM | POA: Insufficient documentation

## 2020-05-11 DIAGNOSIS — E1165 Type 2 diabetes mellitus with hyperglycemia: Secondary | ICD-10-CM | POA: Insufficient documentation

## 2020-05-11 DIAGNOSIS — F319 Bipolar disorder, unspecified: Secondary | ICD-10-CM | POA: Diagnosis not present

## 2020-05-11 DIAGNOSIS — Z79899 Other long term (current) drug therapy: Secondary | ICD-10-CM | POA: Insufficient documentation

## 2020-05-11 LAB — GLUCOSE, CAPILLARY: Glucose-Capillary: 295 mg/dL — ABNORMAL HIGH (ref 70–99)

## 2020-05-11 LAB — COMPREHENSIVE METABOLIC PANEL
ALT: 44 U/L (ref 0–44)
AST: 37 U/L (ref 15–41)
Albumin: 3.8 g/dL (ref 3.5–5.0)
Alkaline Phosphatase: 56 U/L (ref 38–126)
Anion gap: 12 (ref 5–15)
BUN: 14 mg/dL (ref 6–20)
CO2: 21 mmol/L — ABNORMAL LOW (ref 22–32)
Calcium: 9 mg/dL (ref 8.9–10.3)
Chloride: 103 mmol/L (ref 98–111)
Creatinine, Ser: 0.7 mg/dL (ref 0.61–1.24)
GFR calc Af Amer: >60 mL/min (ref >60)
GFR calc non Af Amer: >60 mL/min (ref >60)
Glucose, Bld: 392 mg/dL — ABNORMAL HIGH (ref 70–99)
Potassium: 4.2 mmol/L (ref 3.5–5.1)
Sodium: 136 mmol/L (ref 135–145)
Total Bilirubin: 1 mg/dL (ref 0.3–1.2)
Total Protein: 7.1 g/dL (ref 6.5–8.1)

## 2020-05-11 LAB — URINE DRUG SCREEN, QUALITATIVE (ARMC ONLY)
Amphetamines, Ur Screen: NOT DETECTED
Barbiturates, Ur Screen: NOT DETECTED
Benzodiazepine, Ur Scrn: NOT DETECTED
Cannabinoid 50 Ng, Ur ~~LOC~~: NOT DETECTED
Cocaine Metabolite,Ur ~~LOC~~: NOT DETECTED
MDMA (Ecstasy)Ur Screen: NOT DETECTED
Methadone Scn, Ur: NOT DETECTED
Opiate, Ur Screen: NOT DETECTED
Phencyclidine (PCP) Ur S: NOT DETECTED
Tricyclic, Ur Screen: NOT DETECTED

## 2020-05-11 LAB — CBC
HCT: 46.3 % (ref 39.0–52.0)
Hemoglobin: 16.1 g/dL (ref 13.0–17.0)
MCH: 29.7 pg (ref 26.0–34.0)
MCHC: 34.8 g/dL (ref 30.0–36.0)
MCV: 85.4 fL (ref 80.0–100.0)
Platelets: 235 10*3/uL (ref 150–400)
RBC: 5.42 MIL/uL (ref 4.22–5.81)
RDW: 12.6 % (ref 11.5–15.5)
WBC: 9.5 10*3/uL (ref 4.0–10.5)
nRBC: 0 % (ref 0.0–0.2)

## 2020-05-11 LAB — ACETAMINOPHEN LEVEL: Acetaminophen (Tylenol), Serum: <10 ug/mL — ABNORMAL LOW (ref 10–30)

## 2020-05-11 LAB — SARS CORONAVIRUS 2 BY RT PCR (HOSPITAL ORDER, PERFORMED IN ~~LOC~~ HOSPITAL LAB): SARS Coronavirus 2: NEGATIVE

## 2020-05-11 LAB — ETHANOL: Alcohol, Ethyl (B): <10 mg/dL (ref <10)

## 2020-05-11 LAB — SALICYLATE LEVEL: Salicylate Lvl: <7 mg/dL — ABNORMAL LOW (ref 7.0–30.0)

## 2020-05-11 MED ORDER — LOSARTAN POTASSIUM 50 MG PO TABS
50.0000 mg | ORAL_TABLET | Freq: Every day | ORAL | Status: DC
  Administered 2020-05-11 – 2020-05-12 (×2): 50 mg via ORAL
  Filled 2020-05-11 (×2): qty 1

## 2020-05-11 MED ORDER — INSULIN GLARGINE 100 UNIT/ML ~~LOC~~ SOLN
30.0000 [IU] | Freq: Every day | SUBCUTANEOUS | Status: DC
  Administered 2020-05-12: 30 [IU] via SUBCUTANEOUS
  Filled 2020-05-11 (×2): qty 0.3

## 2020-05-11 MED ORDER — INSULIN ASPART 100 UNIT/ML ~~LOC~~ SOLN
0.0000 [IU] | Freq: Every day | SUBCUTANEOUS | Status: DC
  Administered 2020-05-11: 3 [IU] via SUBCUTANEOUS
  Filled 2020-05-11: qty 1

## 2020-05-11 MED ORDER — HYDROCHLOROTHIAZIDE 12.5 MG PO CAPS
12.5000 mg | ORAL_CAPSULE | Freq: Every day | ORAL | Status: DC
  Administered 2020-05-11 – 2020-05-12 (×2): 12.5 mg via ORAL
  Filled 2020-05-11 (×2): qty 1

## 2020-05-11 MED ORDER — LISINOPRIL 10 MG PO TABS
20.0000 mg | ORAL_TABLET | Freq: Every day | ORAL | Status: DC
  Administered 2020-05-11 – 2020-05-12 (×2): 20 mg via ORAL
  Filled 2020-05-11 (×2): qty 2

## 2020-05-11 MED ORDER — LISINOPRIL-HYDROCHLOROTHIAZIDE 20-12.5 MG PO TABS: 1.0000 | ORAL_TABLET | Freq: Every day | ORAL | Status: DC

## 2020-05-11 MED ORDER — ATORVASTATIN CALCIUM 20 MG PO TABS
20.0000 mg | ORAL_TABLET | Freq: Every day | ORAL | Status: DC
  Administered 2020-05-11 – 2020-05-12 (×2): 20 mg via ORAL
  Filled 2020-05-11 (×2): qty 1

## 2020-05-11 MED ORDER — LORATADINE 10 MG PO TABS
10.0000 mg | ORAL_TABLET | Freq: Every day | ORAL | Status: DC
  Administered 2020-05-11 – 2020-05-12 (×2): 10 mg via ORAL
  Filled 2020-05-11 (×2): qty 1

## 2020-05-11 MED ORDER — INSULIN ASPART 100 UNIT/ML ~~LOC~~ SOLN
0.0000 [IU] | Freq: Three times a day (TID) | SUBCUTANEOUS | Status: DC
  Administered 2020-05-12: 8 [IU] via SUBCUTANEOUS
  Administered 2020-05-12: 11 [IU] via SUBCUTANEOUS
  Filled 2020-05-11 (×2): qty 1

## 2020-05-11 NOTE — ED Triage Notes (Signed)
Pt via pov from home feeling "extremely suicidal." Pt states he has been living with bipolar disorder for years and he knows when he gets to the point of not being able to control the urge. Pt states he has attempted suicide before, several years ago. Pt alert & oriented, flat affect, forthcoming verbally.

## 2020-05-11 NOTE — Consult Note (Signed)
[facility] Face-to-Face Psychiatry Consult   Reason for Consult:Suicidal Referring Physician: Quale Patient Identification: Aaron Braun MRN:  [id number] Principal Diagnosis: <principal problem not specified> Diagnosis:  Active Problems:   Bipolar 1 disorder ([place])   Anxiety   Borderline personality disorder ([place])   Total Time spent with patient: 30 minutes  Subjective: "Right now, I am not suicidal.  I have nowhere to live, and I am living in my car." Aaron Braun is a 49 y.o. male patient presented to [facility] ED via POV voluntarily and the patient was placed under involuntary commitment status by the EDP due to him voicing being extremely suicidal. Per the ED triage nurse notes, the patient is from home feeling "extremely suicidal." The patient states he has been living with bipolar disorder for years, and he knows when he gets to the point of not being able to control the urge. The patient states he has attempted suicide before, several years ago.  The patient reports he is currently homeless. The patient states he sees a therapist, Olevia Bowens [phone] but does not see a psychiatrist and is not taking any psychiatric medications.  He says he lives in his car and is on disability. The patient voiced that he gets $960.00 a month and cannot locate an apartment because no one will rent to him due to his credit history. The patient expressed nothing happened today (07.06.21), but he felt sad and voiced he wanted to hurt himself.   The patient was seen face-to-face by this provider; the chart was reviewed and consulted with Dr.Quale on 05/11/2020 due to the patient's care. It was discussed in the EDP that the patient remained under observation overnight and will be reassessed in the a.m. to determine if he meets the criteria for psychiatric inpatient admission; he could be discharged back home. The patient is alert and oriented x 4, calm, cooperative, and mood-congruent with affect on  evaluation. The patient does not appear to be responding to internal or external stimuli. Neither is the patient presenting with any delusional thinking. The patient denies auditory or visual hallucinations. The patient denies any suicidal, homicidal, or self-harm ideations. The patient is not presenting with any psychotic or paranoid behaviors. During an encounter with the patient, he was able to answer questions appropriately.  Plan: The patient remained under observation overnight and will be reassessed in the a.m. to determine if he meets the criteria for psychiatric inpatient admission; he could be discharged back home.  HPI: Per Dr. Jacqualine Code: Aaron Braun is a 49 y.o. male here for evaluation of suicidal ideation.  Patient reports he is considering "death by cop" He has bipolar disorder.  Denies any took any action to hurt himself.  Denies overdose or ingestion.  Came voluntarily as he reports that this happens who recognizes symptoms are worsening.  He recently broke up or divorced with his wife who is in a short relationship with. Denies any recent illness.  He is diabetic and has high blood pressure.  No fevers or chills.  No Covid exposure. Endorses suicidal ideations  Past Psychiatric History: None  Risk to Self:   No Risk to Others:  No Prior Inpatient Therapy:  No Prior Outpatient Therapy:   Yes  Past Medical History:  Past Medical History:  Diagnosis Date  . Diabetes mellitus without complication ([place])   . Hypertension    History reviewed. No pertinent surgical history. Family History:  Family History  Problem Relation Age of Onset  . Diabetes Mother   .  Thyroid disease Mother   . Hypertension Mother   . Hyperlipidemia Mother   . CAD Mother   . Kidney disease Mother   . Stroke Mother   . Heart attack Mother   . Diabetes Father   . Hypertension Father   . Hyperlipidemia Father   . Heart attack Father   . CAD Father    Family Psychiatric  History:  Social  History:  Social History   Substance and Sexual Activity  Alcohol Use Never     Social History   Substance and Sexual Activity  Drug Use Never    Social History   Socioeconomic History  . Marital status: Single    Spouse name: Not on file  . Number of children: Not on file  . Years of education: Not on file  . Highest education level: Not on file  Occupational History  . Not on file  Tobacco Use  . Smoking status: Current Every Day Smoker    Packs/day: 1.00    Years: 35.00    Pack years: 35.00    Types: Cigarettes  . Smokeless tobacco: Never Used  Vaping Use  . Vaping Use: Never used  Substance and Sexual Activity  . Alcohol use: Never  . Drug use: Never  . Sexual activity: Not on file  Other Topics Concern  . Not on file  Social History Narrative  . Not on file   Social Determinants of Health   Financial Resource Strain:   . Difficulty of Paying Living Expenses:   Food Insecurity:   . Worried About Charity fundraiser in the Last Year:   . Arboriculturist in the Last Year:   Transportation Needs:   . Film/video editor (Medical):   Marland Kitchen Lack of Transportation (Non-Medical):   Physical Activity:   . Days of Exercise per Week:   . Minutes of Exercise per Session:   Stress:   . Feeling of Stress :   Social Connections:   . Frequency of Communication with Friends and Family:   . Frequency of Social Gatherings with Friends and Family:   . Attends Religious Services:   . Active Member of Clubs or Organizations:   . Attends Archivist Meetings:   Marland Kitchen Marital Status:    Additional Social History:    Allergies:   Allergies  Allergen Reactions  . Other Nausea And Vomiting    Mushrooms    Labs:  Results for orders placed or performed during the hospital encounter of 05/11/20 (from the past 48 hour(s))  Comprehensive metabolic panel     Status: Abnormal   Collection Time: 05/11/20  4:41 PM  Result Value Ref Range   Sodium 136 135 - 145 mmol/L    Potassium 4.2 3.5 - 5.1 mmol/L    Comment: HEMOLYSIS AT THIS LEVEL MAY AFFECT RESULT   Chloride 103 98 - 111 mmol/L   CO2 21 (L) 22 - 32 mmol/L   Glucose, Bld 392 (H) 70 - 99 mg/dL    Comment: Glucose reference range applies only to samples taken after fasting for at least 8 hours.   BUN 14 6 - 20 mg/dL   Creatinine, Ser 0.70 0.61 - 1.24 mg/dL   Calcium 9.0 8.9 - 10.3 mg/dL   Total Protein 7.1 6.5 - 8.1 g/dL   Albumin 3.8 3.5 - 5.0 g/dL   AST 37 15 - 41 U/L   ALT 44 0 - 44 U/L   Alkaline Phosphatase 56 38 -  126 U/L   Total Bilirubin 1.0 0.3 - 1.2 mg/dL   GFR calc non Af Amer >60 >60 mL/min   GFR calc Af Amer >60 >60 mL/min   Anion gap 12 5 - 15    Comment: Performed at [facility], [address] [place], [place] [address]  Ethanol     Status: None   Collection Time: 05/11/20  4:41 PM  Result Value Ref Range   Alcohol, Ethyl (B) <10 <10 mg/dL    Comment: (NOTE) Lowest detectable limit for serum alcohol is 10 mg/dL.  For medical purposes only. Performed at [facility], [address] [place], [place] [address]   Salicylate level     Status: Abnormal   Collection Time: 05/11/20  4:41 PM  Result Value Ref Range   Salicylate Lvl <7.0 (L) 7.0 - 30.0 mg/dL    Comment: Performed at [facility], [address] [place], [place] [address]  Acetaminophen level     Status: Abnormal   Collection Time: 05/11/20  4:41 PM  Result Value Ref Range   Acetaminophen (Tylenol), Serum <10 (L) 10 - 30 ug/mL    Comment: (NOTE) Therapeutic concentrations vary significantly. A range of 10-30 ug/mL  may be an effective concentration for many patients. However, some  are best treated at concentrations outside of this range. Acetaminophen concentrations >150 ug/mL at 4 hours after ingestion  and >50 ug/mL at 12 hours after ingestion are often associated with  toxic reactions.  Performed at [facility], [address] [place], [place] [address]    cbc     Status: None   Collection Time: 05/11/20  4:41 PM  Result Value Ref Range   WBC 9.5 4.0 - 10.5 K/uL   RBC 5.42 4.22 - 5.81 MIL/uL   Hemoglobin 16.1 13.0 - 17.0 g/dL   HCT 46.3 39 - 52 %   MCV 85.4 80.0 - 100.0 fL   MCH 29.7 26.0 - 34.0 pg   MCHC 34.8 30.0 - 36.0 g/dL   RDW 12.6 11.5 - 15.5 %   Platelets 235 150 - 400 K/uL   nRBC 0.0 0.0 - 0.2 %    Comment: Performed at [facility], [address] [place], [place] [address]  Urine Drug Screen, Qualitative     Status: None   Collection Time: 05/11/20  4:41 PM  Result Value Ref Range   Tricyclic, Ur Screen NONE DETECTED NONE DETECTED   Amphetamines, Ur Screen NONE DETECTED NONE DETECTED   MDMA (Ecstasy)Ur Screen NONE DETECTED NONE DETECTED   Cocaine Metabolite,Ur [place] NONE DETECTED NONE DETECTED   Opiate, Ur Screen NONE DETECTED NONE DETECTED   Phencyclidine (PCP) Ur S NONE DETECTED NONE DETECTED   Cannabinoid 50 Ng, Ur [place] NONE DETECTED NONE DETECTED   Barbiturates, Ur Screen NONE DETECTED NONE DETECTED   Benzodiazepine, Ur Scrn NONE DETECTED NONE DETECTED   Methadone Scn, Ur NONE DETECTED NONE DETECTED    Comment: (NOTE) Tricyclics + metabolites, urine    Cutoff 1000 ng/mL Amphetamines + metabolites, urine  Cutoff 1000 ng/mL MDMA (Ecstasy), urine              Cutoff 500 ng/mL Cocaine Metabolite, urine          Cutoff 300 ng/mL Opiate + metabolites, urine        Cutoff 300 ng/mL Phencyclidine (PCP), urine         Cutoff 25 ng/mL Cannabinoid, urine  Cutoff 50 ng/mL Barbiturates + metabolites, urine  Cutoff 200 ng/mL Benzodiazepine, urine              Cutoff 200 ng/mL Methadone, urine                   Cutoff 300 ng/mL  The urine drug screen provides only a preliminary, unconfirmed analytical test result and should not be used for non-medical purposes. Clinical consideration and professional judgment should be applied to any positive drug screen result due to possible interfering substances. A  more specific alternate chemical method must be used in order to obtain a confirmed analytical result. Gas chromatography / mass spectrometry (GC/MS) is the preferred confirm atory method. Performed at [facility], [address] [place], [place] [address]   SARS Coronavirus 2 by RT PCR (hospital order, performed in [facility] hospital lab) Nasopharyngeal Nasopharyngeal Swab     Status: None   Collection Time: 05/11/20  8:07 PM   Specimen: Nasopharyngeal Swab  Result Value Ref Range   SARS Coronavirus 2 NEGATIVE NEGATIVE    Comment: (NOTE) SARS-CoV-2 target nucleic acids are NOT DETECTED.  The SARS-CoV-2 RNA is generally detectable in upper and lower respiratory specimens during the acute phase of infection. The lowest concentration of SARS-CoV-2 viral copies this assay can detect is 250 copies / mL. A negative result does not preclude SARS-CoV-2 infection and should not be used as the sole basis for treatment or other patient management decisions.  A negative result may occur with improper specimen collection / handling, submission of specimen other than nasopharyngeal swab, presence of viral mutation(s) within the areas targeted by this assay, and inadequate number of viral copies (<250 copies / mL). A negative result must be combined with clinical observations, patient history, and epidemiological information.  Fact Sheet for Patients:   BoilerBrush.com.cy  Fact Sheet for Healthcare Providers: https://pope.com/  This test is not yet approved or  cleared by the [place] FDA and has been authorized for detection and/or diagnosis of SARS-CoV-2 by FDA under an Emergency Use Authorization (EUA).  This EUA will remain in effect (meaning this test can be used) for the duration of the COVID-19 declaration under Section 564(b)(1) of the Act, 21 U.S.C. section 360bbb-3(b)(1), unless the authorization is terminated  or revoked sooner.  Performed at [facility], [address] [place], [place] [address]   Glucose, capillary     Status: Abnormal   Collection Time: 05/11/20  9:30 PM  Result Value Ref Range   Glucose-Capillary 295 (H) 70 - 99 mg/dL    Comment: Glucose reference range applies only to samples taken after fasting for at least 8 hours.    Current Facility-Administered Medications  Medication Dose Route Frequency Provider Last Rate Last Admin  . atorvastatin (LIPITOR) tablet 20 mg  20 mg Oral Daily Sharyn Creamer, MD   20 mg at 05/11/20 2139  . lisinopril (ZESTRIL) tablet 20 mg  20 mg Oral Daily Albina Billet, [place]   20 mg at 05/11/20 2139   And  . hydrochlorothiazide (MICROZIDE) capsule 12.5 mg  12.5 mg Oral Daily Albina Billet, RPH   12.5 mg at 05/11/20 2139  . [START ON 05/12/2020] insulin aspart (novoLOG) injection 0-15 Units  0-15 Units Subcutaneous TID WC Sharyn Creamer, MD      . insulin aspart (novoLOG) injection 0-5 Units  0-5 Units Subcutaneous QHS Sharyn Creamer, MD   3 Units at 05/11/20 2138  . insulin glargine (LANTUS) injection  30 Units  30 Units Subcutaneous QHS Delman Kitten, MD      . loratadine (CLARITIN) tablet 10 mg  10 mg Oral Daily Delman Kitten, MD   10 mg at 05/11/20 2139  . losartan (COZAAR) tablet 50 mg  50 mg Oral Daily Delman Kitten, MD   50 mg at 05/11/20 2139   Current Outpatient Medications  Medication Sig Dispense Refill  . atorvastatin (LIPITOR) 20 MG tablet Take 20 mg by mouth daily.    Marland Kitchen LANTUS SOLOSTAR 100 UNIT/ML Solostar Pen Inject 30 Units into the skin at bedtime.    Marland Kitchen lisinopril-hydrochlorothiazide (ZESTORETIC) 20-12.5 MG tablet Take 1 tablet by mouth daily.     Marland Kitchen loratadine (CLARITIN) 10 MG tablet Take 10 mg by mouth daily.    Marland Kitchen losartan (COZAAR) 50 MG tablet Take 50 mg by mouth daily.    Marland Kitchen NOVOLOG FLEXPEN 100 UNIT/ML FlexPen Inject 5 Units into the skin 3 (three) times daily.    Marland Kitchen omeprazole (PRILOSEC) 20 MG capsule Take 20 mg by mouth  daily.    Marland Kitchen VICTOZA 18 MG/3ML SOPN Inject 1.2 mg into the skin daily.    . Blood Glucose Monitoring Suppl (ACCU-CHEK GUIDE) w/Device KIT 1 each by Does not apply route 4 (four) times daily. 1 kit 0  . glucose blood (ACCU-CHEK GUIDE) test strip Use as instructed 4 x daily. E11.65 150 each 5  . Insulin Detemir (LEVEMIR FLEXTOUCH) 100 UNIT/ML Pen Inject 60 Units into the skin at bedtime. (Patient not taking: Reported on 05/11/2020) 15 mL 2  . Insulin Pen Needle (PEN NEEDLES) 31G X 8 MM MISC 1 each by Does not apply route at bedtime. 100 each 0  . metFORMIN (GLUCOPHAGE-XR) 500 MG 24 hr tablet Take 500 mg by mouth daily at 6 (six) AM. (Patient not taking: Reported on 05/11/2020)    . mometasone (NASONEX) 50 MCG/ACT nasal spray 2 sprays daily.    . sitaGLIPtin (JANUVIA) 50 MG tablet Take 1 tablet (50 mg total) by mouth daily. (Patient not taking: Reported on 05/11/2020) 30 tablet 3    Musculoskeletal: Strength & Muscle Tone: within normal limits Gait & Station: normal Patient leans: N/A  Psychiatric Specialty Exam: Physical Exam Psychiatric:        Attention and Perception: Attention and perception normal.        Mood and Affect: Mood is depressed. Affect is blunt.        Speech: Speech normal.        Behavior: Behavior normal. Behavior is cooperative.        Cognition and Memory: Cognition and memory normal.        Judgment: Judgment normal.     Review of Systems  Psychiatric/Behavioral: Positive for sleep disturbance. The patient is nervous/anxious.   All other systems reviewed and are negative.   Blood pressure (!) 168/76, pulse 96, temperature 98.7 F (37.1 C), temperature source Oral, resp. rate 17, height 6' (1.829 m), weight (!) 153.3 kg, SpO2 97 %.Body mass index is 45.84 kg/m.  General Appearance: Casual  Eye Contact:  Fair  Speech:  Clear and Coherent  Volume:  Normal  Mood:  Depressed  Affect:  Flat  Thought Process:  Coherent  Orientation:  Full (Time, Place, and Person)   Thought Content:  WDL and Logical  Suicidal Thoughts:  No  Homicidal Thoughts:  No  Memory:  Immediate;   Good Recent;   Good Remote;   Good  Judgement:  Fair  Insight:  Lacking  Psychomotor  Activity:  Normal  Concentration:  Concentration: Good and Attention Span: Good  Recall:  Good  Fund of Knowledge:  Good  Language:  Good  Akathisia:  Negative  Handed:  Right  AIMS (if indicated):     Assets:  Communication Skills Desire for Improvement Housing Physical Health Resilience Social Support  ADL's:  Intact  Cognition:  WNL  Sleep:        Treatment Plan Summary: Daily contact with patient to assess and evaluate symptoms and progress in treatment and Plan The patient remained under observation overnight and will be reassessed in the a.m. to determine if he meets the criteria for psychiatric inpatient admission; he could be discharged back home.  Disposition: Supportive therapy provided about ongoing stressors. The patient remained under observation overnight and will be reassessed in the a.m. to determine if he meets the criteria for psychiatric inpatient admission; he could be discharged back home.  Caroline Sauger, NP 05/11/2020 11:17 PM

## 2020-05-11 NOTE — ED Notes (Signed)
He verbalizes suicidal ideation with a plan to speed and get pulled over by police  "I plan to do suicide by cop"   Pt came here voluntary   EDP has placed him under IVC

## 2020-05-11 NOTE — ED Notes (Signed)
Belongings:  Black swim shoes, white socks, black shorts, red tshirt, cell phone, keys, gray underwear, wallet with no money

## 2020-05-11 NOTE — ED Notes (Signed)
IVC, pend psych consult 

## 2020-05-11 NOTE — ED Notes (Signed)

## 2020-05-11 NOTE — ED Notes (Signed)
Pt to be IVC'd by Dr Fanny Bien

## 2020-05-11 NOTE — ED Provider Notes (Signed)
[facility] Emergency Department Provider Note   ____________________________________________   First MD Initiated Contact with Patient 05/11/20 1659     (approximate)  I have reviewed the triage vital signs and the nursing notes.   HISTORY  Chief Complaint Suicidal    HPI Aaron Braun is a 49 y.o. male here for evaluation of suicidal ideation.  Patient reports he is considering "death by cop"  He has bipolar disorder.  Denies any took any action to hurt himself.  Denies overdose or ingestion.  Came voluntarily as he reports that this happens who recognizes symptoms are worsening.  He recently broke up or divorced with his wife who is in a short relationship with.  Denies any recent illness.  He is diabetic and has high blood pressure.  No fevers or chills.  No Covid exposure.  Endorses suicidal ideations   Past Medical History:  Diagnosis Date  . Diabetes mellitus without complication ([place])   . Hypertension     Patient Active Problem List   Diagnosis Date Noted  . Uncontrolled type 2 diabetes mellitus with hyperglycemia ([place]) 08/26/2019  . Mixed hyperlipidemia 08/26/2019  . Essential hypertension, benign 08/26/2019    History reviewed. No pertinent surgical history.  Prior to Admission medications   Medication Sig Start Date End Date Taking? Authorizing Provider  atorvastatin (LIPITOR) 20 MG tablet Take 20 mg by mouth daily. 01/15/20  Yes [provider]  LANTUS SOLOSTAR 100 UNIT/ML Solostar Pen Inject 30 Units into the skin at bedtime. 03/04/20  Yes [provider]  lisinopril-hydrochlorothiazide (ZESTORETIC) 20-12.5 MG tablet Take 1 tablet by mouth daily.  07/26/17  Yes [provider]  loratadine (CLARITIN) 10 MG tablet Take 10 mg by mouth daily. 03/07/20  Yes [provider]  losartan (COZAAR) 50 MG tablet Take 50 mg by mouth daily. 03/22/20  Yes [provider]  NOVOLOG FLEXPEN 100 UNIT/ML  FlexPen Inject 5 Units into the skin 3 (three) times daily. 03/29/20  Yes [provider]  omeprazole (PRILOSEC) 20 MG capsule Take 20 mg by mouth daily. 05/01/20  Yes [provider]  VICTOZA 18 MG/3ML SOPN Inject 1.2 mg into the skin daily. 03/04/20  Yes [provider]  Blood Glucose Monitoring Suppl (ACCU-CHEK GUIDE) w/Device KIT 1 each by Does not apply route 4 (four) times daily. 10/08/19   Cassandria Anger, MD  glucose blood (ACCU-CHEK GUIDE) test strip Use as instructed 4 x daily. E11.65 10/08/19   Cassandria Anger, MD  Insulin Detemir (LEVEMIR FLEXTOUCH) 100 UNIT/ML Pen Inject 60 Units into the skin at bedtime. Patient not taking: Reported on 05/11/2020 08/26/19   Cassandria Anger, MD  Insulin Pen Needle (PEN NEEDLES) 31G X 8 MM MISC 1 each by Does not apply route at bedtime. 08/26/19   Cassandria Anger, MD  metFORMIN (GLUCOPHAGE-XR) 500 MG 24 hr tablet Take 500 mg by mouth daily at 6 (six) AM. Patient not taking: Reported on 05/11/2020 06/26/17   [provider]  mometasone (NASONEX) 50 MCG/ACT nasal spray 2 sprays daily. 03/02/20   [provider]  sitaGLIPtin (JANUVIA) 50 MG tablet Take 1 tablet (50 mg total) by mouth daily. Patient not taking: Reported on 05/11/2020 08/26/19   Cassandria Anger, MD    Allergies Other  Family History  Problem Relation Age of Onset  . Diabetes Mother   . Thyroid disease Mother   . Hypertension Mother   . Hyperlipidemia Mother   . CAD Mother   .  Kidney disease Mother   . Stroke Mother   . Heart attack Mother   . Diabetes Father   . Hypertension Father   . Hyperlipidemia Father   . Heart attack Father   . CAD Father     Social History Social History   Tobacco Use  . Smoking status: Current Every Day Smoker    Packs/day: 1.00    Years: 35.00    Pack years: 35.00    Types: Cigarettes  . Smokeless tobacco: Never Used  Vaping Use  . Vaping Use: Never used  Substance Use Topics   . Alcohol use: Never  . Drug use: Never    Review of Systems Constitutional: No fever/chills Eyes: No visual changes.   Cardiovascular: Denies chest pain. Respiratory: Denies shortness of breath. Gastrointestinal: No abdominal pain.   Genitourinary: Negative for dysuria. Musculoskeletal: Negative for back pain. Skin: Negative for rash. Neurological: Negative for headaches, areas of focal weakness or numbness.    ____________________________________________   PHYSICAL EXAM:  VITAL SIGNS: ED Triage Vitals  Enc Vitals Group     BP 05/11/20 1635 (!) 159/121     Pulse Rate 05/11/20 1635 (!) 114     Resp 05/11/20 1635 20     Temp 05/11/20 1635 98 F (36.7 C)     Temp Source 05/11/20 1635 Oral     SpO2 05/11/20 1635 97 %     Weight 05/11/20 1636 (!) 338 lb (153.3 kg)     Height 05/11/20 1636 6' (1.829 m)     Head Circumference --      Peak Flow --      Pain Score 05/11/20 1635 6     Pain Loc --      Pain Edu? --      Excl. in [place]? --     Constitutional: Alert and oriented. Well appearing and in no acute distress. Eyes: Conjunctivae are normal. Head: Atraumatic. Nose: No congestion/rhinnorhea. Mouth/Throat: Mucous membranes are moist. Neck: No stridor.  Cardiovascular: Normal rate, regular rhythm. Grossly normal heart sounds.  Good peripheral circulation. Respiratory: Normal respiratory effort.  No retractions. Lungs CTAB. Gastrointestinal: Soft and nontender. No distention. Musculoskeletal: No lower extremity tenderness nor edema. Neurologic:  Normal speech and language. No gross focal neurologic deficits are appreciated.  Skin:  Skin is warm, dry and intact. No rash noted. Psychiatric: Mood and affect are normal. Speech and behavior are normal.  ____________________________________________   LABS (all labs ordered are listed, but only abnormal results are displayed)  Labs Reviewed  COMPREHENSIVE METABOLIC PANEL - Abnormal; Notable for the following  components:      Result Value   CO2 21 (*)    Glucose, Bld 392 (*)    All other components within normal limits  SALICYLATE LEVEL - Abnormal; Notable for the following components:   Salicylate Lvl <6.2 (*)    All other components within normal limits  ACETAMINOPHEN LEVEL - Abnormal; Notable for the following components:   Acetaminophen (Tylenol), Serum <10 (*)    All other components within normal limits  GLUCOSE, CAPILLARY - Abnormal; Notable for the following components:   Glucose-Capillary 295 (*)    All other components within normal limits  SARS CORONAVIRUS 2 BY RT PCR (HOSPITAL ORDER, [facility])  ETHANOL  CBC  URINE DRUG SCREEN, QUALITATIVE ([facility] ONLY)  HEMOGLOBIN A1C   ____________________________________________  EKG   ____________________________________________  RADIOLOGY   ____________________________________________   PROCEDURES  Procedure(s) performed: None  Procedures  Critical  Care performed: No  ____________________________________________   INITIAL IMPRESSION / ASSESSMENT AND PLAN / ED COURSE  Pertinent labs & imaging results that were available during my care of the patient were reviewed by me and considered in my medical decision making (see chart for details).   Patient denies acute medical illness.  Reassuring clinical examination.  Blood sugar is elevated, patient not able to describe his medication or insulin regimen well.  Pharmacy working to obtain med rec   Medication reconciliation completed through patient's pharmacy.  Patient placed on long-acting insulin as prescribed, also placed on sliding scale here. ----------------------------------------- 9:42 PM on 05/11/2020 -----------------------------------------   Patient medically cleared for ongoing psychiatric disposition.  Discussed with psychiatry team who is rounded on him this evening, nurse practitioner advising to maintain under IVC and psychiatric  reassessment to occur by psychiatrist tomorrow.          ____________________________________________   FINAL CLINICAL IMPRESSION(S) / ED DIAGNOSES  Final diagnoses:  Bipolar affective disorder, current episode depressed, current episode severity unspecified (HCC)  Involuntary commitment        Note:  This document was prepared using Dragon voice recognition software and may include unintentional dictation errors       , , MD 05/11/20 2143  

## 2020-05-11 NOTE — ED Notes (Signed)
Pt states it is ok to speak with his counselor, Hardie Pulley [phone]

## 2020-05-12 ENCOUNTER — Other Ambulatory Visit: Payer: Self-pay

## 2020-05-12 ENCOUNTER — Inpatient Hospital Stay
Admission: EM | Admit: 2020-05-12 | Discharge: 2020-05-15 | DRG: 885 | Disposition: A | Discharge status: Home / Self Care | Payer: Medicare HMO | Source: Intra-hospital | Attending: Psychiatry | Admitting: Psychiatry

## 2020-05-12 ENCOUNTER — Encounter: Payer: Self-pay | Admitting: Psychiatry

## 2020-05-12 DIAGNOSIS — I10 Essential (primary) hypertension: Secondary | ICD-10-CM | POA: Diagnosis present

## 2020-05-12 DIAGNOSIS — F3130 Bipolar disorder, current episode depressed, mild or moderate severity, unspecified: Principal | ICD-10-CM | POA: Diagnosis present

## 2020-05-12 DIAGNOSIS — K219 Gastro-esophageal reflux disease without esophagitis: Secondary | ICD-10-CM | POA: Diagnosis present

## 2020-05-12 DIAGNOSIS — R45851 Suicidal ideations: Secondary | ICD-10-CM | POA: Diagnosis not present

## 2020-05-12 DIAGNOSIS — F603 Borderline personality disorder: Secondary | ICD-10-CM | POA: Diagnosis not present

## 2020-05-12 DIAGNOSIS — F319 Bipolar disorder, unspecified: Secondary | ICD-10-CM | POA: Diagnosis not present

## 2020-05-12 DIAGNOSIS — E782 Mixed hyperlipidemia: Secondary | ICD-10-CM | POA: Diagnosis not present

## 2020-05-12 DIAGNOSIS — F17210 Nicotine dependence, cigarettes, uncomplicated: Secondary | ICD-10-CM | POA: Diagnosis present

## 2020-05-12 DIAGNOSIS — Z20822 Contact with and (suspected) exposure to covid-19: Secondary | ICD-10-CM | POA: Diagnosis not present

## 2020-05-12 DIAGNOSIS — E1165 Type 2 diabetes mellitus with hyperglycemia: Secondary | ICD-10-CM | POA: Diagnosis not present

## 2020-05-12 DIAGNOSIS — F419 Anxiety disorder, unspecified: Secondary | ICD-10-CM | POA: Diagnosis not present

## 2020-05-12 DIAGNOSIS — R259 Unspecified abnormal involuntary movements: Secondary | ICD-10-CM | POA: Diagnosis not present

## 2020-05-12 LAB — GLUCOSE, CAPILLARY
Glucose-Capillary: 324 mg/dL — ABNORMAL HIGH (ref 70–99)
Glucose-Capillary: 255 mg/dL — ABNORMAL HIGH (ref 70–99)
Glucose-Capillary: 352 mg/dL — ABNORMAL HIGH (ref 70–99)

## 2020-05-12 MED ORDER — ATORVASTATIN CALCIUM 20 MG PO TABS
20.0000 mg | ORAL_TABLET | Freq: Every day | ORAL | Status: DC
  Administered 2020-05-13 – 2020-05-15 (×3): 20 mg via ORAL
  Filled 2020-05-12 (×3): qty 1

## 2020-05-12 MED ORDER — INSULIN ASPART 100 UNIT/ML ~~LOC~~ SOLN
5.0000 [IU] | Freq: Three times a day (TID) | SUBCUTANEOUS | Status: DC
  Administered 2020-05-13 – 2020-05-14 (×4): 5 [IU] via SUBCUTANEOUS
  Filled 2020-05-12 (×3): qty 1

## 2020-05-12 MED ORDER — LORATADINE 10 MG PO TABS
10.0000 mg | ORAL_TABLET | Freq: Every day | ORAL | Status: DC
  Administered 2020-05-13 – 2020-05-15 (×3): 10 mg via ORAL
  Filled 2020-05-12 (×3): qty 1

## 2020-05-12 MED ORDER — ALUM & MAG HYDROXIDE-SIMETH 200-200-20 MG/5ML PO SUSP: 30.0000 mL | ORAL | Status: DC | PRN

## 2020-05-12 MED ORDER — PANTOPRAZOLE SODIUM 40 MG PO TBEC
40.0000 mg | DELAYED_RELEASE_TABLET | Freq: Every day | ORAL | Status: DC
  Administered 2020-05-13 – 2020-05-15 (×3): 40 mg via ORAL
  Filled 2020-05-12 (×3): qty 1

## 2020-05-12 MED ORDER — LOSARTAN POTASSIUM 50 MG PO TABS
50.0000 mg | ORAL_TABLET | Freq: Every day | ORAL | Status: DC
  Administered 2020-05-13 – 2020-05-15 (×3): 50 mg via ORAL
  Filled 2020-05-12 (×4): qty 1

## 2020-05-12 MED ORDER — MAGNESIUM HYDROXIDE 400 MG/5ML PO SUSP: 30.0000 mL | Freq: Every day | ORAL | Status: DC | PRN

## 2020-05-12 MED ORDER — INSULIN ASPART 100 UNIT/ML ~~LOC~~ SOLN
0.0000 [IU] | Freq: Every day | SUBCUTANEOUS | Status: DC
  Administered 2020-05-12 – 2020-05-13 (×2): 5 [IU] via SUBCUTANEOUS
  Administered 2020-05-14: 2 [IU] via SUBCUTANEOUS
  Filled 2020-05-12 (×3): qty 1

## 2020-05-12 MED ORDER — INSULIN GLARGINE 100 UNIT/ML ~~LOC~~ SOLN
45.0000 [IU] | Freq: Every day | SUBCUTANEOUS | Status: DC
  Filled 2020-05-12 (×2): qty 0.45

## 2020-05-12 MED ORDER — INSULIN ASPART 100 UNIT/ML ~~LOC~~ SOLN
5.0000 [IU] | Freq: Three times a day (TID) | SUBCUTANEOUS | Status: DC
  Administered 2020-05-12 (×2): 5 [IU] via SUBCUTANEOUS
  Filled 2020-05-12 (×2): qty 1

## 2020-05-12 MED ORDER — INSULIN ASPART 100 UNIT/ML ~~LOC~~ SOLN
0.0000 [IU] | Freq: Three times a day (TID) | SUBCUTANEOUS | Status: DC
  Administered 2020-05-13 – 2020-05-14 (×4): 8 [IU] via SUBCUTANEOUS
  Administered 2020-05-14: 5 [IU] via SUBCUTANEOUS
  Administered 2020-05-14: 8 [IU] via SUBCUTANEOUS
  Administered 2020-05-15: 5 [IU] via SUBCUTANEOUS
  Filled 2020-05-12 (×6): qty 1

## 2020-05-12 MED ORDER — ACETAMINOPHEN 325 MG PO TABS: 650.0000 mg | ORAL_TABLET | Freq: Four times a day (QID) | ORAL | Status: DC | PRN

## 2020-05-12 MED ORDER — PANTOPRAZOLE SODIUM 40 MG PO TBEC
40.0000 mg | DELAYED_RELEASE_TABLET | Freq: Every day | ORAL | Status: DC
  Administered 2020-05-12: 40 mg via ORAL
  Filled 2020-05-12: qty 1

## 2020-05-12 MED ORDER — INSULIN GLARGINE 100 UNIT/ML ~~LOC~~ SOLN
45.0000 [IU] | Freq: Every day | SUBCUTANEOUS | Status: DC
  Filled 2020-05-12: qty 0.45

## 2020-05-12 NOTE — Progress Notes (Addendum)
Inpatient Diabetes Program Recommendations  AACE/ADA: New Consensus Statement on Inpatient Glycemic Control  Target Ranges:  Prepandial:   less than 140 mg/dL      Peak postprandial:   less than 180 mg/dL (1-2 hours)      Critically ill patients:  140 - 180 mg/dL   Results for Aaron Braun, Aaron Braun (MRN [id number]) as of 05/12/2020 08:08  Ref. Range 05/11/2020 21:30 05/12/2020 07:55  Glucose-Capillary Latest Ref Range: 70 - 99 mg/dL 371 (H) 062 (H)   Review of Glycemic Control  Diabetes history: DM2 Outpatient Diabetes medications: Lantus 30 units QHS Current orders for Inpatient glycemic control: Lantus 30 units QHS, Novolog 0-15 units TID with meals, Novolog 0-5 units QHS  Inpatient Diabetes Program Recommendations:    Insulin-Basal: Please consider increasing Lantus to 45 units QHS (based on 153.3 kg x 0.3 units).  Insulin-Meal Coverage: Please consider ordering Novolog 5 units TID with meals for meal coverage if patient eats at least 50% of meals.  Diet: Please discontinue Regular diet and order Carb Modified diet.  Thanks, Orlando Penner, RN, MSN, CDE Diabetes Coordinator Inpatient Diabetes Program [phone] (Team Pager from 8am to 5pm)

## 2020-05-12 NOTE — BH Assessment (Signed)
TTS completed reassessment. Pt presents calm, pleasant and oriented x 3. Pt reports to feel bored and is requesting to resume his medication ( Bipolar hx; 2 year noncompliance). Pt reports to feel unsure about INPT and stated "I mean its unless".  Pt reports to be currently experiencing AH but denies any current SI/HI/VH. Pt reports to be homeless and is currently unable to contract for safety.   Per Dr. Smith Robert pt meets criteria for INPT

## 2020-05-12 NOTE — BH Assessment (Signed)
Patient can come down now  Call to give report: [phone]  Patient is to be admitted to [facility] by Dr. Toni Amend.  Attending Physician will be. Dr. Toni Amend.   Patient has been assigned to room 306, by [facility] Charge Nurse Lindie Spruce, RN.   Intake Paper Work has been signed and placed on patient chart.  ER staff is aware of the admission: 1. Carlisle Beers, ER Secretary  2. Mayford Knife, ER MD  3. Amy, Patient's Nurse  4. THO Patient Access.

## 2020-05-12 NOTE — ED Notes (Signed)
Report called to joy rn in beh med.

## 2020-05-12 NOTE — Progress Notes (Signed)
Admission Note:   Pt admitted to BMU from ED for suicidal ideation. Pt has been alert and oriented to person, place, time and situation. Pt is calm, cooperative, pleasant, flat affect, mood is depressed, denies any suicidal ideation at this time reports a suicide attempt 20 years ago by overdose. Pt reports he is homeless, having financial issues. Pt reports this is his first inpatient psych admission at this facility but has had many others at other facilities over the last 20 years. Pt is ambulatory, steady gait, denies surgical history, medical history includes: obesity, diabetes, insulin dependent, HTN. No distress noted, none reported. Will continue to monitor pt per Q15 minute face checks and monitor for safety and progress.

## 2020-05-12 NOTE — ED Notes (Signed)
Resumed care from Claribel Sachs t rn.  Pt  Alert, calm and cooperative.

## 2020-05-12 NOTE — BH Assessment (Signed)
Assessment Note  Aaron Braun is an 49 y.o. male. Pt presented to the ED voluntarily and was placed under IVC by the EDP.  Upon interview, pt. stated, "Everything came to an end today. I left my friend's house. Went to a gas station to use the bathroom. I remember walking out saying screw it all and feeling that I just wanted to kill myself." When pt. was asked if he had a suicidal plan the pt. could not articulate a specific plan. The pt. presented with a disheveled appearance and was oriented x4. The patient was calm and cooperative. Pt's speech was unremarkable and he reported that he is currently homeless. Pt reported that he has had SI for the past couple of weeks, however the thoughts were more intense than ever today. Pt reported that he has poor sleep due to sleeping I his car. Pt expressed that he does not have a psychiatrist; however, he does see a therapist Nurse, [facility]). The pt. listed his mother as a family support, however he is unable to live with her due to strict restrictions at her apartment complex. Pt reported previous diagnoses of bipolar, anxiety, and borderline personality disorder. Pt reported multiple symptoms of depression such as worthlessness, hopelessness, and despair. Pt reported that he is on disability and identified his current stressors as not being able to find stable housing due to his credit being shot. Pt denied HI/AV/H.  Diagnosis: Bipolar 1 disorder (HCC)   Anxiety   Borderline personality disorder [facility])  Past Medical History:  Past Medical History:  Diagnosis Date  . Diabetes mellitus without complication (HCC)   . Hypertension     History reviewed. No pertinent surgical history.  Family History:  Family History  Problem Relation Age of Onset  . Diabetes Mother   . Thyroid disease Mother   . Hypertension Mother   . Hyperlipidemia Mother   . CAD Mother   . Kidney disease Mother   . Stroke Mother   . Heart attack Mother   . Diabetes  Father   . Hypertension Father   . Hyperlipidemia Father   . Heart attack Father   . CAD Father     Social History:  reports that he has been smoking cigarettes. He has a 35.00 pack-year smoking history. He has never used smokeless tobacco. He reports that he does not drink alcohol and does not use drugs.  Additional Social History:  Alcohol / Drug Use Pain Medications: See PTA Prescriptions: See PTA History of alcohol / drug use?: Yes Longest period of sobriety (when/how long): 11 years Negative Consequences of Use: Personal relationships Substance #1 Name of Substance 1: Alcohol  CIWA: CIWA-Ar BP: (!) 168/76 Pulse Rate: 96 COWS:    Allergies:  Allergies  Allergen Reactions  . Other Nausea And Vomiting    Mushrooms    Home Medications: (Not in a hospital admission)   OB/GYN Status:  No LMP for male patient.  General Assessment Data Location of Assessment: [facility] ED TTS Assessment: In system Is this a Tele or Face-to-Face Assessment?: Face-to-Face Is this an Initial Assessment or a Re-assessment for this encounter?: Initial Assessment Patient Accompanied by:: N/A Language Other than English: No Living Arrangements: Homeless/Shelter What gender do you identify as?: Male Date Telepsych consult ordered in [facility]: 05/11/20 Time Telepsych consult ordered in [facility]: 1637 Marital status: Divorced Pregnancy Status: No Living Arrangements: Other (Comment) Can pt return to current living arrangement?: Yes Admission Status: Involuntary Petitioner: ED Attending Is patient capable of signing voluntary  admission?: Yes Referral Source: Self/Family/Friend Insurance type: [employer] Medicare  Medical Screening Exam [facility] Walk-in ONLY) Medical Exam completed: Yes  Crisis Care Plan Living Arrangements: Other (Comment) Legal Guardian:  (Self) Name of Psychiatrist: None Noted Name of Therapist: None Noted  Education Status Is patient currently in school?: No Is the patient employed,  unemployed or receiving disability?: Receiving disability income  Risk to self with the past 6 months Suicidal Ideation: No Has patient been a risk to self within the past 6 months prior to admission? : No Suicidal Intent: No Has patient had any suicidal intent within the past 6 months prior to admission? : No Is patient at risk for suicide?: No Suicidal Plan?: No Has patient had any suicidal plan within the past 6 months prior to admission? : No Access to Means: No What has been your use of drugs/alcohol within the last 12 months?: Clean/sober 11 years Previous Attempts/Gestures: No Other Self Harm Risks: none noted Triggers for Past Attempts: None known Intentional Self Injurious Behavior: None Family Suicide History: Unknown Recent stressful life event(s): Conflict (Comment) Persecutory voices/beliefs?: No Depression: Yes Depression Symptoms: Despondent, Feeling worthless/self pity Substance abuse history and/or treatment for substance abuse?: Yes Suicide prevention information given to non-admitted patients: Not applicable  Risk to Others within the past 6 months Homicidal Ideation: No Does patient have any lifetime risk of violence toward others beyond the six months prior to admission? : No Thoughts of Harm to Others: No Current Homicidal Intent: No Current Homicidal Plan: No Access to Homicidal Means: No Identified Victim: n/a History of harm to others?: No Assessment of Violence: None Noted Violent Behavior Description: n/a Does patient have access to weapons?: No Criminal Charges Pending?: No Does patient have a court date: No Is patient on probation?: No  Psychosis Hallucinations: None noted Delusions: None noted  Mental Status Report Appearance/Hygiene: Disheveled Eye Contact: Good Motor Activity: Freedom of movement Speech: Logical/coherent Level of Consciousness: Alert Mood: Anxious, Despair, Helpless Affect: Anxious, Sullen Anxiety Level:  Minimal Judgement: Unimpaired Orientation: Person, Place, Time, Situation Obsessive Compulsive Thoughts/Behaviors: None  Cognitive Functioning Concentration: Normal Memory: Recent Intact, Remote Intact Is patient IDD: No Insight: Good Impulse Control: Fair Appetite: Good Have you had any weight changes? : No Change Sleep: No Change Vegetative Symptoms: None  ADLScreening [facility] Assessment Services) Patient able to express need for assistance with ADLs?: Yes Independently performs ADLs?: Yes (appropriate for developmental age)  Prior Inpatient Therapy Prior Inpatient Therapy: No  Prior Outpatient Therapy Prior Outpatient Therapy: Yes Prior Therapy Facilty/Provider(s): [facility] Reason for Treatment: Mental Health Does patient have an ACCT team?: No Does patient have Intensive In-House Services?  : No Does patient have [facility] services? : No Does patient have P4CC services?: No  ADL Screening (condition at time of admission) Is the patient deaf or have difficulty hearing?: No Does the patient have difficulty seeing, even when wearing glasses/contacts?: No Does the patient have difficulty concentrating, remembering, or making decisions?: No Patient able to express need for assistance with ADLs?: Yes Does the patient have difficulty dressing or bathing?: No Independently performs ADLs?: Yes (appropriate for developmental age) Does the patient have difficulty walking or climbing stairs?: No Weakness of Legs: None Weakness of Arms/Hands: None  Home Assistive Devices/Equipment Home Assistive Devices/Equipment: None  Therapy Consults (therapy consults require a physician order) PT Evaluation Needed: No OT Evalulation Needed: No SLP Evaluation Needed: No       Advance Directives (For Healthcare) Does Patient Have a Medical Advance Directive?:  No          Disposition: Per Psych NP: The patient can remain under observation overnight and will be reassessed in  the a.m. to determine if he meets the criteria for psychiatric inpatient admission; he could be discharged back home. Disposition Initial Assessment Completed for this Encounter: Yes  On Site Evaluation by:   Reviewed with Physician:    Foy Guadalajara 05/12/2020 2:12 AM

## 2020-05-12 NOTE — ED Notes (Signed)
Pt calm and cooperative, allowed this EDT to check glucose level.

## 2020-05-12 NOTE — ED Provider Notes (Signed)
Emergency Medicine Observation Re-evaluation Note  Aaron Braun is a 49 y.o. male, seen on rounds today.  Pt initially presented to the ED for complaints of Suicidal  Currently, the patient is awake in bed. He denies any SI. Denies any other medical concerns.   Physical Exam  BP (!) 168/76 (BP Location: Right Arm)   Pulse 96   Temp 98.7 F (37.1 C) (Oral)   Resp 17   Ht 6' (1.829 m)   Wt (!) 153.3 kg   SpO2 97%   BMI 45.84 kg/m  Physical Exam Physical Exam General: No apparent distress, overweight male  HEENT: moist mucous membranes CV: RRR Pulm: Normal WOB GI: soft and non tender MSK: no edema or cyanosis Neuro: face symmetric, moving all extremities    ED Course / MDM  EKG:    I have reviewed the labs performed to date as well as medications administered while in observation.  Recent changes in the last 24 hours include last sugar 295. On insulin. Added on sugar checks before meals/bedtime.   Plan  Current plan is for psych re-eval  Patient is under full IVC at this time.   Concha Se, MD 05/12/20 [phone]

## 2020-05-13 DIAGNOSIS — F319 Bipolar disorder, unspecified: Secondary | ICD-10-CM

## 2020-05-13 LAB — HEMOGLOBIN A1C
Hgb A1c MFr Bld: 11.6 % — ABNORMAL HIGH (ref 4.8–5.6)
Mean Plasma Glucose: 286 mg/dL

## 2020-05-13 LAB — GLUCOSE, CAPILLARY
Glucose-Capillary: 284 mg/dL — ABNORMAL HIGH (ref 70–99)
Glucose-Capillary: 290 mg/dL — ABNORMAL HIGH (ref 70–99)
Glucose-Capillary: 273 mg/dL — ABNORMAL HIGH (ref 70–99)
Glucose-Capillary: 359 mg/dL — ABNORMAL HIGH (ref 70–99)

## 2020-05-13 MED ORDER — INSULIN GLARGINE 100 UNIT/ML ~~LOC~~ SOLN
45.0000 [IU] | Freq: Every day | SUBCUTANEOUS | Status: DC
  Administered 2020-05-13 – 2020-05-14 (×2): 45 [IU] via SUBCUTANEOUS
  Filled 2020-05-13 (×2): qty 0.45

## 2020-05-13 MED ORDER — DIVALPROEX SODIUM 500 MG PO DR TAB
500.0000 mg | DELAYED_RELEASE_TABLET | Freq: Two times a day (BID) | ORAL | Status: DC
  Administered 2020-05-13 – 2020-05-15 (×5): 500 mg via ORAL
  Filled 2020-05-13 (×5): qty 1

## 2020-05-13 NOTE — Progress Notes (Signed)
Recreation Therapy Notes      Date: 05/13/2020  Time: 9:30 am   Location: Craft room  Behavioral response: N/A   Intervention Topic: Coping Skills     Discussion/Intervention: Patient did not attend group.   Clinical Observations/Feedback:  Patient did not attend group.   Reford Olliff LRT/CTRS        Calani Gick 05/13/2020 11:48 AM 

## 2020-05-13 NOTE — Plan of Care (Signed)
°  Problem: Education: Goal: Utilization of techniques to improve thought processes will improve Outcome: Not Progressing Goal: Knowledge of the prescribed therapeutic regimen will improve Outcome: Not Progressing   Problem: Activity: Goal: Interest or engagement in leisure activities will improve Outcome: Not Progressing Goal: Imbalance in normal sleep/wake cycle will improve Outcome: Not Progressing   Problem: Coping: Goal: Coping ability will improve Outcome: Not Progressing Goal: Will verbalize feelings Outcome: Not Progressing   Problem: Health Behavior/Discharge Planning: Goal: Ability to make decisions will improve Outcome: Not Progressing Goal: Compliance with therapeutic regimen will improve Outcome: Not Progressing   Problem: Role Relationship: Goal: Will demonstrate positive changes in social behaviors and relationships Outcome: Not Progressing   Problem: Safety: Goal: Ability to disclose and discuss suicidal ideas will improve Outcome: Not Progressing Goal: Ability to identify and utilize support systems that promote safety will improve Outcome: Not Progressing   Problem: Self-Concept: Goal: Will verbalize positive feelings about self Outcome: Not Progressing Goal: Level of anxiety will decrease Outcome: Not Progressing   Problem: Education: Goal: Ability to make informed decisions regarding treatment will improve Outcome: Not Progressing   Problem: Coping: Goal: Coping ability will improve Outcome: Not Progressing   Problem: Health Behavior/Discharge Planning: Goal: Identification of resources available to assist in meeting health care needs will improve Outcome: Not Progressing   Problem: Medication: Goal: Compliance with prescribed medication regimen will improve Outcome: Not Progressing   Problem: Self-Concept: Goal: Ability to disclose and discuss suicidal ideas will improve Outcome: Not Progressing Goal: Will verbalize positive feelings  about self Outcome: Not Progressing   Problem: Education: Goal: Knowledge of [facility] Education information/materials will improve Outcome: Not Progressing Goal: Emotional status will improve Outcome: Not Progressing Goal: Mental status will improve Outcome: Not Progressing Goal: Verbalization of understanding the information provided will improve Outcome: Not Progressing   Problem: Activity: Goal: Interest or engagement in activities will improve Outcome: Not Progressing Goal: Sleeping patterns will improve Outcome: Not Progressing   Problem: Coping: Goal: Ability to verbalize frustrations and anger appropriately will improve Outcome: Not Progressing Goal: Ability to demonstrate self-control will improve Outcome: Not Progressing   Problem: Health Behavior/Discharge Planning: Goal: Identification of resources available to assist in meeting health care needs will improve Outcome: Not Progressing Goal: Compliance with treatment plan for underlying cause of condition will improve Outcome: Not Progressing   Problem: Physical Regulation: Goal: Ability to maintain clinical measurements within normal limits will improve Outcome: Not Progressing   Problem: Safety: Goal: Periods of time without injury will increase Outcome: Not Progressing
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
## 2020-05-13 NOTE — Progress Notes (Addendum)
Inpatient Diabetes Program Recommendations  AACE/ADA: New Consensus Statement on Inpatient Glycemic Control   Target Ranges:  Prepandial:   less than 140 mg/dL      Peak postprandial:   less than 180 mg/dL (1-2 hours)      Critically ill patients:  140 - 180 mg/dL   Results for ARRICK, DUTTON (MRN [id number]) as of 05/13/2020 09:39  Ref. Range 05/12/2020 07:55 05/12/2020 13:45 05/12/2020 21:59 05/13/2020 08:04  Glucose-Capillary Latest Ref Range: 70 - 99 mg/dL 979 (H) 480 (H) 165 (H) 284 (H)   Review of Glycemic Control  Diabetes history: DM2 Outpatient Diabetes medications: Lantus 30 units QHS, Victoza 1.2 mg daily, Novolog 5 units TID with meals Current orders for Inpatient glycemic control: Lantus 45 units QHS, Novolog 0-15 units TID with meals, Novolog 0-5 units QHS, Novolog 5 units TID with meals for meal coverage  Inpatient Diabetes Program Recommendations:    Insulin-Basal: Lantus was not given last night (charted as not available).  Please consider changing Lantus to 45 units daily to start now so patient will receive this morning.  Thanks, Orlando Penner, RN, MSN, CDE Diabetes Coordinator Inpatient Diabetes Program [phone] (Team Pager from 8am to 5pm)

## 2020-05-13 NOTE — H&P (Signed)
Psychiatric Admission Assessment Adult  Patient Identification: Aaron Braun MRN:  [id number] Date of Evaluation:  05/13/2020 Chief Complaint:  Bipolar depression ([place]) [F31.9] Principal Diagnosis: Bipolar depression ([place]) Diagnosis:  Principal Problem:   Bipolar depression ([place]) Active Problems:   Uncontrolled type 2 diabetes mellitus with hyperglycemia ([place])   Mixed hyperlipidemia   Essential hypertension, benign  History of Present Illness: Patient seen and chart reviewed.  49 year old man brought himself voluntarily to the emergency room complaining of depression and suicidal ideation.  Patient tells me he has been off of his medicine for many months.  Recently he has been feeling more down and overwhelmed.  Had not slept well for many days.  The other day he was driving around in his car and found himself having suicidal thoughts with specific thoughts of trying to get a policeman to kill him by trying to assault 1.  Patient denies any auditory or visual hallucinations.  Denies any intention of suicidal or violent behavior in the hospital.  He admits that his blood sugars may not be under good control.  Requires a lot of insulin at baseline but he says he has been taking it.  Multiple life stresses.  He had been recently married but his wife left him after less than a month of marriage and putting him out resulting in his living in his car for the last couple months. Associated Signs/Symptoms: Depression Symptoms:  depressed mood, difficulty concentrating, hopelessness, suicidal thoughts with specific plan, anxiety, disturbed sleep, (Hypo) Manic Symptoms:  Distractibility, Anxiety Symptoms:  Excessive Worry, Psychotic Symptoms:  Paranoia, PTSD Symptoms: Negative Total Time spent with patient: 1 hour  Past Psychiatric History: Patient reports a long history of a diagnosis of bipolar disorder.  Says he has had several hospitalizations in the past most recently about a year and a  half ago.  He does have a past history of suicide attempts but estimates it is been 20 years since the last time he tried to kill himself.  Patient cannot remember any medicines he has taken in the past for bipolar disorder other than Abilify which made his diabetes worse.  Later after I reviewed some names of medicine he says that he recalls having been on Depakote in the past with positive effect.  He states that he is a recovering addict and has been sober for years but used to have a problem with alcohol and drugs  Is the patient at risk to self? Yes.    Has the patient been a risk to self in the past 6 months? Yes.    Has the patient been a risk to self within the distant past? Yes.    Is the patient a risk to others? No.  Has the patient been a risk to others in the past 6 months? No.  Has the patient been a risk to others within the distant past? No.   Prior Inpatient Therapy:   Prior Outpatient Therapy:    Alcohol Screening: 1. How often do you have a drink containing alcohol?: Never 2. How many drinks containing alcohol do you have on a typical day when you are drinking?: 1 or 2 3. How often do you have six or more drinks on one occasion?: Never AUDIT-C Score: 0 4. How often during the last year have you found that you were not able to stop drinking once you had started?: Never 5. How often during the last year have you failed to do what was normally expected from you because  of drinking?: Never 6. How often during the last year have you needed a first drink in the morning to get yourself going after a heavy drinking session?: Never 7. How often during the last year have you had a feeling of guilt of remorse after drinking?: Never 8. How often during the last year have you been unable to remember what happened the night before because you had been drinking?: Never 9. Have you or someone else been injured as a result of your drinking?: No 10. Has a relative or friend or a doctor or  another health worker been concerned about your drinking or suggested you cut down?: No Alcohol Use Disorder Identification Test Final Score (AUDIT): 0 Alcohol Brief Interventions/Follow-up: AUDIT Score <7 follow-up not indicated Substance Abuse History in the last 12 months:  No. Consequences of Substance Abuse: Negative Previous Psychotropic Medications: Yes  Psychological Evaluations: Yes  Past Medical History:  Past Medical History:  Diagnosis Date  . Diabetes mellitus without complication ([place])   . Hypertension    History reviewed. No pertinent surgical history. Family History:  Family History  Problem Relation Age of Onset  . Diabetes Mother   . Thyroid disease Mother   . Hypertension Mother   . Hyperlipidemia Mother   . CAD Mother   . Kidney disease Mother   . Stroke Mother   . Heart attack Mother   . Diabetes Father   . Hypertension Father   . Hyperlipidemia Father   . Heart attack Father   . CAD Father    Family Psychiatric  History: Patient reports multiple members of his family with bipolar disorder but denies knowing of anyone having killed themselves Tobacco Screening:   Social History:  Social History   Substance and Sexual Activity  Alcohol Use Never     Social History   Substance and Sexual Activity  Drug Use Never    Additional Social History: Marital status: Separated Separated, when?: April 2021 What types of issues is patient dealing with in the relationship?: "me being on disability" Does patient have children?: Yes How many children?: 2 How is patient's relationship with their children?: "really good with one the other thinks I am the worst thing to walk the Earth".                         Allergies:   Allergies  Allergen Reactions  . Other Nausea And Vomiting    Mushrooms   Lab Results:  Results for orders placed or performed during the hospital encounter of 05/12/20 (from the past 48 hour(s))  Glucose, capillary      Status: Abnormal   Collection Time: 05/12/20  9:59 PM  Result Value Ref Range   Glucose-Capillary 352 (H) 70 - 99 mg/dL    Comment: Glucose reference range applies only to samples taken after fasting for at least 8 hours.  Glucose, capillary     Status: Abnormal   Collection Time: 05/13/20  8:04 AM  Result Value Ref Range   Glucose-Capillary 284 (H) 70 - 99 mg/dL    Comment: Glucose reference range applies only to samples taken after fasting for at least 8 hours.  Glucose, capillary     Status: Abnormal   Collection Time: 05/13/20 11:36 AM  Result Value Ref Range   Glucose-Capillary 290 (H) 70 - 99 mg/dL    Comment: Glucose reference range applies only to samples taken after fasting for at least 8 hours.   Comment 1  Notify RN    Comment 2 Document in Chart     Blood Alcohol level:  Lab Results  Component Value Date   ETH <10 05/11/2020   ETH <10 57/32/2025    Metabolic Disorder Labs:  Lab Results  Component Value Date   HGBA1C 11.6 (H) 05/11/2020   MPG 286 05/11/2020   No results found for: PROLACTIN No results found for: CHOL, TRIG, HDL, CHOLHDL, VLDL, LDLCALC  Current Medications: Current Facility-Administered Medications  Medication Dose Route Frequency Provider Last Rate Last Admin  . acetaminophen (TYLENOL) tablet 650 mg  650 mg Oral Q6H PRN Leshay Desaulniers T, MD      . alum & mag hydroxide-simeth (MAALOX/MYLANTA) 200-200-20 MG/5ML suspension 30 mL  30 mL Oral Q4H PRN Tysheka Fanguy T, MD      . atorvastatin (LIPITOR) tablet 20 mg  20 mg Oral Daily Ferry Matthis, Madie Reno, MD   20 mg at 05/13/20 0759  . divalproex (DEPAKOTE) DR tablet 500 mg  500 mg Oral Q12H Oluwaseun Bruyere T, MD   500 mg at 05/13/20 1140  . insulin aspart (novoLOG) injection 0-15 Units  0-15 Units Subcutaneous TID WC Tyquon Near, Madie Reno, MD   8 Units at 05/13/20 1139  . insulin aspart (novoLOG) injection 0-5 Units  0-5 Units Subcutaneous QHS Joanna Hall, Madie Reno, MD   5 Units at 05/12/20 2204  . insulin aspart (novoLOG)  injection 5 Units  5 Units Subcutaneous TID WC Arien Morine, Madie Reno, MD   5 Units at 05/13/20 1139  . insulin glargine (LANTUS) injection 45 Units  45 Units Subcutaneous Daily Candence Sease T, MD      . loratadine (CLARITIN) tablet 10 mg  10 mg Oral Daily Skyelyn Scruggs, Madie Reno, MD   10 mg at 05/13/20 0759  . losartan (COZAAR) tablet 50 mg  50 mg Oral Daily Jori Thrall, Madie Reno, MD   50 mg at 05/13/20 0904  . magnesium hydroxide (MILK OF MAGNESIA) suspension 30 mL  30 mL Oral Daily PRN Saryiah Bencosme T, MD      . pantoprazole (PROTONIX) EC tablet 40 mg  40 mg Oral Daily Malloree Raboin, Madie Reno, MD   40 mg at 05/13/20 0759   PTA Medications: Medications Prior to Admission  Medication Sig Dispense Refill Last Dose  . atorvastatin (LIPITOR) 20 MG tablet Take 20 mg by mouth daily.     . Blood Glucose Monitoring Suppl (ACCU-CHEK GUIDE) w/Device KIT 1 each by Does not apply route 4 (four) times daily. 1 kit 0   . glucose blood (ACCU-CHEK GUIDE) test strip Use as instructed 4 x daily. E11.65 150 each 5   . Insulin Detemir (LEVEMIR FLEXTOUCH) 100 UNIT/ML Pen Inject 60 Units into the skin at bedtime. (Patient not taking: Reported on 05/11/2020) 15 mL 2   . Insulin Pen Needle (PEN NEEDLES) 31G X 8 MM MISC 1 each by Does not apply route at bedtime. 100 each 0   . LANTUS SOLOSTAR 100 UNIT/ML Solostar Pen Inject 30 Units into the skin at bedtime.     Marland Kitchen loratadine (CLARITIN) 10 MG tablet Take 10 mg by mouth daily.     Marland Kitchen losartan (COZAAR) 50 MG tablet Take 50 mg by mouth daily.     . mometasone (NASONEX) 50 MCG/ACT nasal spray Place 2 sprays into the nose daily as needed (allergies).      . NOVOLOG FLEXPEN 100 UNIT/ML FlexPen Inject 5 Units into the skin 3 (three) times daily.     Marland Kitchen omeprazole (PRILOSEC) 20  MG capsule Take 20 mg by mouth daily.     . sitaGLIPtin (JANUVIA) 50 MG tablet Take 1 tablet (50 mg total) by mouth daily. (Patient not taking: Reported on 05/11/2020) 30 tablet 3   . VICTOZA 18 MG/3ML SOPN Inject 1.2 mg into the skin  daily.       Musculoskeletal: Strength & Muscle Tone: within normal limits Gait & Station: normal Patient leans: N/A  Psychiatric Specialty Exam: Physical Exam Vitals and nursing note reviewed.  Constitutional:      Appearance: He is well-developed.  HENT:     Head: Normocephalic and atraumatic.  Eyes:     Conjunctiva/sclera: Conjunctivae normal.     Pupils: Pupils are equal, round, and reactive to light.  Cardiovascular:     Heart sounds: Normal heart sounds.  Pulmonary:     Effort: Pulmonary effort is normal.  Abdominal:     Palpations: Abdomen is soft.  Musculoskeletal:        General: Normal range of motion.     Cervical back: Normal range of motion.  Skin:    General: Skin is warm and dry.  Neurological:     General: No focal deficit present.     Mental Status: He is alert.  Psychiatric:        Attention and Perception: Attention normal.        Mood and Affect: Mood is depressed.        Speech: Speech normal.        Behavior: Behavior is cooperative.        Thought Content: Thought content includes suicidal ideation. Thought content does not include suicidal plan.        Cognition and Memory: Cognition normal.        Judgment: Judgment normal.     Review of Systems  Constitutional: Negative.   HENT: Negative.   Eyes: Negative.   Respiratory: Negative.   Cardiovascular: Negative.   Gastrointestinal: Negative.   Musculoskeletal: Negative.   Skin: Negative.   Neurological: Negative.   Psychiatric/Behavioral: Positive for dysphoric mood and suicidal ideas.    Blood pressure (!) 146/123, pulse (!) 101, temperature (!) 97.5 F (36.4 C), temperature source Oral, resp. rate 18, height 6' (1.829 m), weight (!) 153.3 kg, SpO2 99 %.Body mass index is 45.84 kg/m.  General Appearance: Casual  Eye Contact:  Good  Speech:  Clear and Coherent  Volume:  Normal  Mood:  Euthymic  Affect:  Congruent  Thought Process:  Coherent  Orientation:  Full (Time, Place, and  Person)  Thought Content:  Logical  Suicidal Thoughts:  Yes.  without intent/plan  Homicidal Thoughts:  No  Memory:  Immediate;   Fair Recent;   Fair Remote;   Fair  Judgement:  Fair  Insight:  Fair  Psychomotor Activity:  Normal  Concentration:  Concentration: Fair  Recall:  [employer] of Knowledge:  Fair  Language:  Fair  Akathisia:  No  Handed:  Right  AIMS (if indicated):     Assets:  Desire for Improvement Financial Resources/Insurance  ADL's:  Intact  Cognition:  WNL  Sleep:  Number of Hours: 6.75    Treatment Plan Summary: Daily contact with patient to assess and evaluate symptoms and progress in treatment, Medication management and Plan Patient's blood sugars are poorly controlled up in the 300s at times.  Already communicated with diabetes coordinator.  We will give him his Lantus today and continue the meal coverage as well as the sliding scale.  Blood  pressure is also elevated.  After getting him back on his medicine we will reassess to see if he needs more than his current blood pressure medicine.  After reviewing medications for bipolar we will start Depakote 500 mg twice a day.  This gentleman is under a lot of stress from not having a place to live but does get disability.  Social work has already met with him and supplied him with a list of possible options of places to live.  Ongoing reassessment of dangerousness before establishing outpatient treatment as needed.  Observation Level/Precautions:  15 minute checks  Laboratory:  HbAIC  Psychotherapy:    Medications:    Consultations:    Discharge Concerns:    Estimated LOS:  Other:     Physician Treatment Plan for Primary Diagnosis: Bipolar depression ([place]) Long Term Goal(s): Improvement in symptoms so as ready for discharge  Short Term Goals: Ability to verbalize feelings will improve, Ability to disclose and discuss suicidal ideas and Ability to demonstrate self-control will improve  Physician Treatment Plan  for Secondary Diagnosis: Principal Problem:   Bipolar depression ([place]) Active Problems:   Uncontrolled type 2 diabetes mellitus with hyperglycemia ([place])   Mixed hyperlipidemia   Essential hypertension, benign  Long Term Goal(s): Improvement in symptoms so as ready for discharge  Short Term Goals: Ability to maintain clinical measurements within normal limits will improve and Compliance with prescribed medications will improve  I certify that inpatient services furnished can reasonably be expected to improve the patient's condition.    Alethia Berthold, MD 7/8/202111:43 AM

## 2020-05-13 NOTE — Plan of Care (Signed)
D: Pt alert and oriented x 4. Pt rates depression 1/10, hopelessness 0/10, and anxiety 1/10.Pt goal: "talk to a dr." Pt reports energy level as normal. Pt reports sleep last night as being good. Pt did not receive medications. Pt denies experiencing any pain at this time. Pt denies experiencing any SI/HI, or AVH at this time.   A: Scheduled medications administered to pt, per MD orders. Support and encouragement provided. Frequent verbal contact made. Routine safety checks conducted q15 minutes.   R: No adverse drug reactions noted. Pt verbally contracts for safety at this time. Pt complaint with medications and treatment plan. Pt interacts well with others on the unit. Pt remains safe at this time. Will continue to monitor.   Problem: Education: Goal: Knowledge of the prescribed therapeutic regimen will improve Outcome: Progressing   Problem: Activity: Goal: Imbalance in normal sleep/wake cycle will improve Outcome: Progressing   Problem: Coping: Goal: Will verbalize feelings Outcome: Progressing   Problem: Health Behavior/Discharge Planning: Goal: Ability to make decisions will improve Outcome: Progressing Goal: Compliance with therapeutic regimen will improve Outcome: Progressing   Problem: Role Relationship: Goal: Will demonstrate positive changes in social behaviors and relationships Outcome: Progressing   Problem: Safety: Goal: Ability to disclose and discuss suicidal ideas will improve Outcome: Progressing   Problem: Self-Concept: Goal: Level of anxiety will decrease Outcome: Progressing   Problem: Education: Goal: Ability to make informed decisions regarding treatment will improve Outcome: Progressing   Problem: Coping: Goal: Coping ability will improve Outcome: Progressing   Problem: Medication: Goal: Compliance with prescribed medication regimen will improve Outcome: Progressing   Problem: Self-Concept: Goal: Ability to disclose and discuss suicidal ideas  will improve Outcome: Progressing   Problem: Education: Goal: Knowledge of [facility] Education information/materials will improve Outcome: Progressing Goal: Emotional status will improve Outcome: Progressing Goal: Mental status will improve Outcome: Progressing Goal: Verbalization of understanding the information provided will improve Outcome: Progressing   Problem: Activity: Goal: Sleeping patterns will improve Outcome: Progressing   Problem: Education: Goal: Utilization of techniques to improve thought processes will improve Outcome: Not Progressing

## 2020-05-13 NOTE — [facility]HH Group Notes (Signed)
LCSW Group Therapy Note  05/13/2020 2:50 PM  Type of Therapy/Topic:  Group Therapy:  Balance in Life  Participation Level:  Did Not Attend  Description of Group:    This group will address the concept of balance and how it feels and looks when one is unbalanced. Patients will be encouraged to process areas in their lives that are out of balance and identify reasons for remaining unbalanced. Facilitators will guide patients in utilizing problem-solving interventions to address and correct the stressor making their life unbalanced. Understanding and applying boundaries will be explored and addressed for obtaining and maintaining a balanced life. Patients will be encouraged to explore ways to assertively make their unbalanced needs known to significant others in their lives, using other group members and facilitator for support and feedback.  Therapeutic Goals: 1. Patient will identify two or more emotions or situations they have that consume much of in their lives. 2. Patient will identify signs/triggers that life has become out of balance:  3. Patient will identify two ways to set boundaries in order to achieve balance in their lives:  4. Patient will demonstrate ability to communicate their needs through discussion and/or role plays  Summary of Patient Progress: X  Therapeutic Modalities:   Cognitive Behavioral Therapy Solution-Focused Therapy Assertiveness Training  Penni Homans MSW, LCSW 05/13/2020 2:50 PM

## 2020-05-13 NOTE — Tx Team (Addendum)
Interdisciplinary Treatment and Diagnostic Plan Update  05/13/2020 Time of Session: 9:00AM Aaron Braun MRN: [id number]  Principal Diagnosis: Bipolar depression ([place])  Secondary Diagnoses: Principal Problem:   Bipolar depression ([place]) Active Problems:   Uncontrolled type 2 diabetes mellitus with hyperglycemia ([place])   Mixed hyperlipidemia   Essential hypertension, benign   Current Medications:  Current Facility-Administered Medications  Medication Dose Route Frequency Provider Last Rate Last Admin   acetaminophen (TYLENOL) tablet 650 mg  650 mg Oral Q6H PRN Clapacs, John T, MD       alum & mag hydroxide-simeth (MAALOX/MYLANTA) 200-200-20 MG/5ML suspension 30 mL  30 mL Oral Q4H PRN Clapacs, John T, MD       atorvastatin (LIPITOR) tablet 20 mg  20 mg Oral Daily Clapacs, John T, MD   20 mg at 05/13/20 0759   divalproex (DEPAKOTE) DR tablet 500 mg  500 mg Oral Q12H Clapacs, John T, MD   500 mg at 05/13/20 1140   insulin aspart (novoLOG) injection 0-15 Units  0-15 Units Subcutaneous TID WC Clapacs, John T, MD   8 Units at 05/13/20 1139   insulin aspart (novoLOG) injection 0-5 Units  0-5 Units Subcutaneous QHS Clapacs, John T, MD   5 Units at 05/12/20 2204   insulin aspart (novoLOG) injection 5 Units  5 Units Subcutaneous TID WC Clapacs, Madie Reno, MD   5 Units at 05/13/20 1139   insulin glargine (LANTUS) injection 45 Units  45 Units Subcutaneous Daily Clapacs, Madie Reno, MD   45 Units at 05/13/20 1211   loratadine (CLARITIN) tablet 10 mg  10 mg Oral Daily Clapacs, Madie Reno, MD   10 mg at 05/13/20 0759   losartan (COZAAR) tablet 50 mg  50 mg Oral Daily Clapacs, John T, MD   50 mg at 05/13/20 6378   magnesium hydroxide (MILK OF MAGNESIA) suspension 30 mL  30 mL Oral Daily PRN Clapacs, Madie Reno, MD       pantoprazole (PROTONIX) EC tablet 40 mg  40 mg Oral Daily Clapacs, John T, MD   40 mg at 05/13/20 0759   PTA Medications: Medications Prior to Admission  Medication Sig Dispense Refill  Last Dose   atorvastatin (LIPITOR) 20 MG tablet Take 20 mg by mouth daily.      Blood Glucose Monitoring Suppl (ACCU-CHEK GUIDE) w/Device KIT 1 each by Does not apply route 4 (four) times daily. 1 kit 0    glucose blood (ACCU-CHEK GUIDE) test strip Use as instructed 4 x daily. E11.65 150 each 5    Insulin Detemir (LEVEMIR FLEXTOUCH) 100 UNIT/ML Pen Inject 60 Units into the skin at bedtime. (Patient not taking: Reported on 05/11/2020) 15 mL 2    Insulin Pen Needle (PEN NEEDLES) 31G X 8 MM MISC 1 each by Does not apply route at bedtime. 100 each 0    LANTUS SOLOSTAR 100 UNIT/ML Solostar Pen Inject 30 Units into the skin at bedtime.      loratadine (CLARITIN) 10 MG tablet Take 10 mg by mouth daily.      losartan (COZAAR) 50 MG tablet Take 50 mg by mouth daily.      mometasone (NASONEX) 50 MCG/ACT nasal spray Place 2 sprays into the nose daily as needed (allergies).       NOVOLOG FLEXPEN 100 UNIT/ML FlexPen Inject 5 Units into the skin 3 (three) times daily.      omeprazole (PRILOSEC) 20 MG capsule Take 20 mg by mouth daily.      sitaGLIPtin (JANUVIA) 50 MG  tablet Take 1 tablet (50 mg total) by mouth daily. (Patient not taking: Reported on 05/11/2020) 30 tablet 3    VICTOZA 18 MG/3ML SOPN Inject 1.2 mg into the skin daily.       Patient Stressors:    Patient Strengths:    Treatment Modalities: Medication Management, Group therapy, Case management,  1 to 1 session with clinician, Psychoeducation, Recreational therapy.   Physician Treatment Plan for Primary Diagnosis: Bipolar depression ([place]) Long Term Goal(s): Improvement in symptoms so as ready for discharge Improvement in symptoms so as ready for discharge   Short Term Goals: Ability to verbalize feelings will improve Ability to disclose and discuss suicidal ideas Ability to demonstrate self-control will improve Ability to maintain clinical measurements within normal limits will improve Compliance with prescribed medications  will improve  Medication Management: Evaluate patient's response, side effects, and tolerance of medication regimen.  Therapeutic Interventions: 1 to 1 sessions, Unit Group sessions and Medication administration.  Evaluation of Outcomes: Not Met  Physician Treatment Plan for Secondary Diagnosis: Principal Problem:   Bipolar depression ([place]) Active Problems:   Uncontrolled type 2 diabetes mellitus with hyperglycemia ([place])   Mixed hyperlipidemia   Essential hypertension, benign  Long Term Goal(s): Improvement in symptoms so as ready for discharge Improvement in symptoms so as ready for discharge   Short Term Goals: Ability to verbalize feelings will improve Ability to disclose and discuss suicidal ideas Ability to demonstrate self-control will improve Ability to maintain clinical measurements within normal limits will improve Compliance with prescribed medications will improve     Medication Management: Evaluate patient's response, side effects, and tolerance of medication regimen.  Therapeutic Interventions: 1 to 1 sessions, Unit Group sessions and Medication administration.  Evaluation of Outcomes: Not Met   RN Treatment Plan for Primary Diagnosis: Bipolar depression ([place]) Long Term Goal(s): Knowledge of disease and therapeutic regimen to maintain health will improve  Short Term Goals: Ability to verbalize frustration and anger appropriately will improve, Ability to demonstrate self-control, Ability to participate in decision making will improve, Ability to verbalize feelings will improve and Compliance with prescribed medications will improve  Medication Management: RN will administer medications as ordered by provider, will assess and evaluate patient's response and provide education to patient for prescribed medication. RN will report any adverse and/or side effects to prescribing provider.  Therapeutic Interventions: 1 on 1 counseling sessions, Psychoeducation, Medication  administration, Evaluate responses to treatment, Monitor vital signs and CBGs as ordered, Perform/monitor CIWA, COWS, AIMS and Fall Risk screenings as ordered, Perform wound care treatments as ordered.  Evaluation of Outcomes: Not Met   LCSW Treatment Plan for Primary Diagnosis: Bipolar depression ([place]) Long Term Goal(s): Safe transition to appropriate next level of care at discharge, Engage patient in therapeutic group addressing interpersonal concerns.  Short Term Goals: Engage patient in aftercare planning with referrals and resources, Increase social support, Increase ability to appropriately verbalize feelings, Increase emotional regulation and Increase skills for wellness and recovery  Therapeutic Interventions: Assess for all discharge needs, 1 to 1 time with Social worker, Explore available resources and support systems, Assess for adequacy in community support network, Educate family and significant other(s) on suicide prevention, Complete Psychosocial Assessment, Interpersonal group therapy.  Evaluation of Outcomes: Not Met   Progress in Treatment: Attending groups: Yes. Participating in groups: Yes. Taking medication as prescribed: Yes. Toleration medication: Yes. Family/Significant other contact made: No, will contact:  once permission is given. Patient understands diagnosis: Yes. Discussing patient identified problems/goals with staff: Yes. Medical  problems stabilized or resolved: Yes. Denies suicidal/homicidal ideation: Yes. Issues/concerns per patient self-inventory: No. Other: none  New problem(s) identified: No, Describe:  none  New Short Term/Long Term Goal(s): medication management for mood stabilization; elimination of SI thoughts; development of comprehensive mental wellness plan.  Patient Goals:  "I need to get on some type of medication"  Discharge Plan or Barriers: Patient reports plans to continue with his current therapist.  CSW will assist the patient in  identifying a psychiatrist for medication management.  Reason for Continuation of Hospitalization: Anxiety Depression Medication stabilization Suicidal ideation  Estimated Length of Stay: 1-7 days  Recreational Therapy: Patient: N/A Patient Goal: Patient will engage in groups without prompting or encouragement from LRT x3 group sessions within 5 recreation therapy group sessions  Attendees: Patient: Aaron Braun 05/13/2020 12:41 PM  Physician: Dr. Weber Cooks, MD 05/13/2020 12:41 PM  Nursing: West Pugh, RN 05/13/2020 12:41 PM  RN Care Manager: 05/13/2020 12:41 PM  Social Worker: Assunta Curtis, LCSW 05/13/2020 12:41 PM  Recreational Therapist: Devin Going, LRT 05/13/2020 12:41 PM  Other: Sanjuana Kava, [place] 05/13/2020 12:41 PM  Other:  05/13/2020 12:41 PM  Other: 05/13/2020 12:41 PM    Scribe for Treatment Team: Rozann Lesches, LCSW 05/13/2020 12:41 PM

## 2020-05-13 NOTE — Progress Notes (Signed)
Patient is quiet and reserved. Pleasant and easy to engage. Patient denies SI/HI/AVH depression and anxiety at this encounter.   Received 5 units of Novalog for BS 352. Pharmacy contacted regarding 45 units of Lantus that was not loaded in Pyxsis system. Patient wanted to go to bed and did not want to wait on his medication. He is safe on the unit with 15 minute rounds and informed to contact staff with any concerns.

## 2020-05-13 NOTE — BHH Suicide Risk Assessment (Signed)
[facility] Admission Suicide Risk Assessment   Nursing information obtained from:  Patient Demographic factors:  Male, Caucasian, Low socioeconomic status, Living alone, Unemployed Current Mental Status:  Suicidal ideation indicated by others, NA Loss Factors:  Legal issues Historical Factors:  Prior suicide attempts (Pt reports 20 years ago he attempted to OD to kill self. ) Risk Reduction Factors:  Positive coping skills or problem solving skills  Total Time spent with patient: 1 hour Principal Problem: Bipolar depression (H[facility]) Diagnosis:  Principal Problem:   Bipolar depression (H[facility]) Active Problems:   Uncontrolled type 2 diabetes mellitus with hyperglycemia ([facility])   Mixed hyperlipidemia   Essential hypertension, benign  Subjective Data: Patient seen and chart reviewed.  49 year old man presented himself voluntarily to the emergency room with complaints of depression with suicidal ideation.  Patient says he is not actively intending or planning to kill himself but still feels depressed and anxious.  Denies any psychotic symptoms.  Indicates willingness to be cooperative with treatment.  Continued Clinical Symptoms:  Alcohol Use Disorder Identification Test Final Score (AUDIT): 0 The "Alcohol Use Disorders Identification Test", Guidelines for Use in Primary Care, Second Edition.  World Science writer [facility]). Score between 0-7:  no or low risk or alcohol related problems. Score between 8-15:  moderate risk of alcohol related problems. Score between 16-19:  high risk of alcohol related problems. Score 20 or above:  warrants further diagnostic evaluation for alcohol dependence and treatment.   CLINICAL FACTORS:   Bipolar Disorder:   Depressive phase   Musculoskeletal: Strength & Muscle Tone: within normal limits Gait & Station: normal Patient leans: N/A  Psychiatric Specialty Exam: Physical Exam Vitals and nursing note reviewed.  Constitutional:      Appearance: He is  well-developed.  HENT:     Head: Normocephalic and atraumatic.  Eyes:     Conjunctiva/sclera: Conjunctivae normal.     Pupils: Pupils are equal, round, and reactive to light.  Cardiovascular:     Heart sounds: Normal heart sounds.  Pulmonary:     Effort: Pulmonary effort is normal.  Abdominal:     Palpations: Abdomen is soft.  Musculoskeletal:        General: Normal range of motion.     Cervical back: Normal range of motion.  Skin:    General: Skin is warm and dry.  Neurological:     General: No focal deficit present.     Mental Status: He is alert.  Psychiatric:        Attention and Perception: Attention normal.        Mood and Affect: Mood is anxious and depressed.        Speech: Speech normal.        Behavior: Behavior normal.        Thought Content: Thought content is not paranoid. Thought content includes suicidal ideation. Thought content does not include homicidal ideation. Thought content does not include suicidal plan.        Cognition and Memory: Cognition normal.        Judgment: Judgment normal.     Review of Systems  Constitutional: Negative.   HENT: Negative.   Eyes: Negative.   Respiratory: Negative.   Cardiovascular: Negative.   Gastrointestinal: Negative.   Musculoskeletal: Negative.   Skin: Negative.   Neurological: Negative.   Psychiatric/Behavioral: Positive for suicidal ideas. The patient is nervous/anxious.     Blood pressure (!) 146/123, pulse (!) 101, temperature (!) 97.5 F (36.4 C), temperature source Oral, resp. rate 18, height  6' (1.829 m), weight (!) 153.3 kg, SpO2 99 %.Body mass index is 45.84 kg/m.  General Appearance: Casual  Eye Contact:  Good  Speech:  Clear and Coherent  Volume:  Normal  Mood:  Dysphoric  Affect:  Congruent  Thought Process:  Coherent  Orientation:  Full (Time, Place, and Person)  Thought Content:  Logical  Suicidal Thoughts:  Yes.  without intent/plan  Homicidal Thoughts:  No  Memory:  Immediate;    Fair Recent;   Fair Remote;   Fair  Judgement:  Fair  Insight:  Fair  Psychomotor Activity:  Normal  Concentration:  Concentration: Fair  Recall:  [employer] of Knowledge:  Fair  Language:  Fair  Akathisia:  No  Handed:  Right  AIMS (if indicated):     Assets:  Desire for Improvement Resilience  ADL's:  Intact  Cognition:  WNL  Sleep:  Number of Hours: 6.75      COGNITIVE FEATURES THAT CONTRIBUTE TO RISK:  None    SUICIDE RISK:   Mild:  Suicidal ideation of limited frequency, intensity, duration, and specificity.  There are no identifiable plans, no associated intent, mild dysphoria and related symptoms, good self-control (both objective and subjective assessment), few other risk factors, and identifiable protective factors, including available and accessible social support.  PLAN OF CARE: Continue appropriate treatment for medical illness.  Initiate medication for bipolar disorder with Depakote.  Continue 15-minute checks.  Include an appropriate group and individual therapy.  Reassess dangerousness prior to discharge  I certify that inpatient services furnished can reasonably be expected to improve the patient's condition.   Mordecai Rasmussen, MD 05/13/2020, 11:40 AM

## 2020-05-14 LAB — GLUCOSE, CAPILLARY
Glucose-Capillary: 247 mg/dL — ABNORMAL HIGH (ref 70–99)
Glucose-Capillary: 255 mg/dL — ABNORMAL HIGH (ref 70–99)
Glucose-Capillary: 262 mg/dL — ABNORMAL HIGH (ref 70–99)
Glucose-Capillary: 237 mg/dL — ABNORMAL HIGH (ref 70–99)

## 2020-05-14 MED ORDER — INSULIN ASPART 100 UNIT/ML ~~LOC~~ SOLN
10.0000 [IU] | Freq: Three times a day (TID) | SUBCUTANEOUS | Status: DC
  Administered 2020-05-14 – 2020-05-15 (×3): 10 [IU] via SUBCUTANEOUS
  Filled 2020-05-14 (×2): qty 1

## 2020-05-14 MED ORDER — INSULIN GLARGINE 100 UNIT/ML ~~LOC~~ SOLN
10.0000 [IU] | Freq: Once | SUBCUTANEOUS | Status: AC
  Administered 2020-05-14: 10 [IU] via SUBCUTANEOUS
  Filled 2020-05-14: qty 0.1

## 2020-05-14 MED ORDER — INSULIN GLARGINE 100 UNIT/ML ~~LOC~~ SOLN
55.0000 [IU] | Freq: Every day | SUBCUTANEOUS | Status: DC
  Administered 2020-05-15: 55 [IU] via SUBCUTANEOUS
  Filled 2020-05-14: qty 0.55

## 2020-05-14 NOTE — Progress Notes (Signed)
Inpatient Diabetes Program Recommendations  AACE/ADA: New Consensus Statement on Inpatient Glycemic Control   Target Ranges:  Prepandial:   less than 140 mg/dL      Peak postprandial:   less than 180 mg/dL (1-2 hours)      Critically ill patients:  140 - 180 mg/dL   Results for RYKIN, ROUTE (MRN [id number]) as of 05/14/2020 10:05  Ref. Range 05/13/2020 08:04 05/13/2020 11:36 05/13/2020 16:24 05/13/2020 21:05 05/14/2020 06:59  Glucose-Capillary Latest Ref Range: 70 - 99 mg/dL 409 (H) 811 (H) 914 (H) 359 (H) 247 (H)   Review of Glycemic Control  Diabetes history:DM2 Outpatient Diabetes medications:Lantus 30 units QHS, Victoza 1.2 mg daily, Novolog 5 units TID with meals Current orders for Inpatient glycemic control:Lantus 45 units daily, Novolog 0-15 units TID with meals, Novolog 0-5 units QHS, Novolog 5 units TID with meals for meal coverage  Inpatient Diabetes Program Recommendations:    Insulin-Basal: Please consider increasing Lantus to 55 units daily (to start 05/15/20). If MD agreeable, please also order one time of Lantus 10 units x1 now (for total of 55 units today since patient has received Lantus 45 units already this morning)  Insulin-Meal Coverage: Please consider increasing meal coverage to Novolog 10 units TID with meals.   Thanks, Orlando Penner, RN, MSN, CDE Diabetes Coordinator Inpatient Diabetes Program [phone] (Team Pager from 8am to 5pm)

## 2020-05-14 NOTE — Plan of Care (Signed)
  Problem: Education: Goal: Utilization of techniques to improve thought processes will improve Outcome: Progressing Goal: Knowledge of the prescribed therapeutic regimen will improve Outcome: Progressing   Problem: Activity: Goal: Interest or engagement in leisure activities will improve Outcome: Progressing Goal: Imbalance in normal sleep/wake cycle will improve Outcome: Progressing   Problem: Coping: Goal: Coping ability will improve Outcome: Progressing Goal: Will verbalize feelings Outcome: Progressing   Problem: Health Behavior/Discharge Planning: Goal: Ability to make decisions will improve Outcome: Progressing Goal: Compliance with therapeutic regimen will improve Outcome: Progressing   Problem: Role Relationship: Goal: Will demonstrate positive changes in social behaviors and relationships Outcome: Progressing   Problem: Safety: Goal: Ability to disclose and discuss suicidal ideas will improve Outcome: Progressing Goal: Ability to identify and utilize support systems that promote safety will improve Outcome: Progressing   Problem: Self-Concept: Goal: Will verbalize positive feelings about self Outcome: Progressing Goal: Level of anxiety will decrease Outcome: Progressing   Problem: Education: Goal: Ability to make informed decisions regarding treatment will improve Outcome: Progressing   Problem: Coping: Goal: Coping ability will improve Outcome: Progressing   Problem: Health Behavior/Discharge Planning: Goal: Identification of resources available to assist in meeting health care needs will improve Outcome: Progressing   Problem: Medication: Goal: Compliance with prescribed medication regimen will improve Outcome: Progressing   Problem: Self-Concept: Goal: Ability to disclose and discuss suicidal ideas will improve Outcome: Progressing Goal: Will verbalize positive feelings about self Outcome: Progressing   Problem: Education: Goal: Knowledge of  [place] [facility] Education information/materials will improve Outcome: Progressing Goal: Emotional status will improve Outcome: Progressing Goal: Mental status will improve Outcome: Progressing Goal: Verbalization of understanding the information provided will improve Outcome: Progressing   Problem: Activity: Goal: Interest or engagement in activities will improve Outcome: Progressing Goal: Sleeping patterns will improve Outcome: Progressing   Problem: Coping: Goal: Ability to verbalize frustrations and anger appropriately will improve Outcome: Progressing Goal: Ability to demonstrate self-control will improve Outcome: Progressing   Problem: Health Behavior/Discharge Planning: Goal: Identification of resources available to assist in meeting health care needs will improve Outcome: Progressing Goal: Compliance with treatment plan for underlying cause of condition will improve Outcome: Progressing   Problem: Physical Regulation: Goal: Ability to maintain clinical measurements within normal limits will improve Outcome: Progressing   Problem: Safety: Goal: Periods of time without injury will increase Outcome: Progressing

## 2020-05-14 NOTE — Progress Notes (Signed)
[facility] MD Progress Note  05/14/2020 1:26 PM Mariana Goytia  MRN:  [id number] Subjective: Patient seen chart reviewed.  Blood sugars continue to run consistently high.  Diabetes coordinator had recommendations for changes in dosing which have been done.  Patient says his mood is feeling "fine".  Denies any suicidal thought.  Feels stable.  Feels agreeable to discharge planning. Principal Problem: Bipolar depression (HCC) Diagnosis: Principal Problem:   Bipolar depression (HCC) Active Problems:   Uncontrolled type 2 diabetes mellitus with hyperglycemia (HCC)   Mixed hyperlipidemia   Essential hypertension, benign  Total Time spent with patient: 30 minutes  Past Psychiatric History: Past history of a diagnosis of bipolar disorder  Past Medical History:  Past Medical History:  Diagnosis Date  . Diabetes mellitus without complication (HCC)   . Hypertension    History reviewed. No pertinent surgical history. Family History:  Family History  Problem Relation Age of Onset  . Diabetes Mother   . Thyroid disease Mother   . Hypertension Mother   . Hyperlipidemia Mother   . CAD Mother   . Kidney disease Mother   . Stroke Mother   . Heart attack Mother   . Diabetes Father   . Hypertension Father   . Hyperlipidemia Father   . Heart attack Father   . CAD Father    Family Psychiatric  History: See previous Social History:  Social History   Substance and Sexual Activity  Alcohol Use Never     Social History   Substance and Sexual Activity  Drug Use Never    Social History   Socioeconomic History  . Marital status: Legally Separated    Spouse name: Not on file  . Number of children: Not on file  . Years of education: Not on file  . Highest education level: Not on file  Occupational History  . Not on file  Tobacco Use  . Smoking status: Current Every Day Smoker    Packs/day: 1.00    Years: 35.00    Pack years: 35.00    Types: Cigarettes  . Smokeless tobacco: Never Used   Vaping Use  . Vaping Use: Never used  Substance and Sexual Activity  . Alcohol use: Never  . Drug use: Never  . Sexual activity: Not on file  Other Topics Concern  . Not on file  Social History Narrative  . Not on file   Social Determinants of Health   Financial Resource Strain:   . Difficulty of Paying Living Expenses:   Food Insecurity:   . Worried About Programme researcher, broadcasting/film/video in the Last Year:   . Barista in the Last Year:   Transportation Needs:   . Freight forwarder (Medical):   Marland Kitchen Lack of Transportation (Non-Medical):   Physical Activity:   . Days of Exercise per Week:   . Minutes of Exercise per Session:   Stress:   . Feeling of Stress :   Social Connections:   . Frequency of Communication with Friends and Family:   . Frequency of Social Gatherings with Friends and Family:   . Attends Religious Services:   . Active Member of Clubs or Organizations:   . Attends Banker Meetings:   Marland Kitchen Marital Status:    Additional Social History:                         Sleep: Fair  Appetite:  Fair  Current Medications: Current Facility-Administered Medications  Medication Dose Route Frequency Provider Last Rate Last Admin  . acetaminophen (TYLENOL) tablet 650 mg  650 mg Oral Q6H PRN Laqueisha Catalina T, MD      . alum & mag hydroxide-simeth (MAALOX/MYLANTA) 200-200-20 MG/5ML suspension 30 mL  30 mL Oral Q4H PRN Khamia Stambaugh T, MD      . atorvastatin (LIPITOR) tablet 20 mg  20 mg Oral Daily Jerritt Cardoza, Jackquline Denmark, MD   20 mg at 05/14/20 0739  . divalproex (DEPAKOTE) DR tablet 500 mg  500 mg Oral Q12H Matheus Spiker, Jackquline Denmark, MD   500 mg at 05/14/20 0739  . insulin aspart (novoLOG) injection 0-15 Units  0-15 Units Subcutaneous TID WC Clarissia Mckeen, Jackquline Denmark, MD   8 Units at 05/14/20 1133  . insulin aspart (novoLOG) injection 0-5 Units  0-5 Units Subcutaneous QHS Naydelin Ziegler, Jackquline Denmark, MD   5 Units at 05/13/20 2116  . insulin aspart (novoLOG) injection 10 Units  10 Units  Subcutaneous TID WC Ayjah Show, Jackquline Denmark, MD   10 Units at 05/14/20 1132  . [START ON 05/15/2020] insulin glargine (LANTUS) injection 55 Units  55 Units Subcutaneous Daily Darryon Bastin T, MD      . loratadine (CLARITIN) tablet 10 mg  10 mg Oral Daily Dajiah Kooi, Jackquline Denmark, MD   10 mg at 05/14/20 0739  . losartan (COZAAR) tablet 50 mg  50 mg Oral Daily Danielly Ackerley, Jackquline Denmark, MD   50 mg at 05/14/20 0740  . magnesium hydroxide (MILK OF MAGNESIA) suspension 30 mL  30 mL Oral Daily PRN Uzma Hellmer T, MD      . pantoprazole (PROTONIX) EC tablet 40 mg  40 mg Oral Daily Diera Wirkkala, Jackquline Denmark, MD   40 mg at 05/14/20 0272    Lab Results:  Results for orders placed or performed during the hospital encounter of 05/12/20 (from the past 48 hour(s))  Glucose, capillary     Status: Abnormal   Collection Time: 05/12/20  9:59 PM  Result Value Ref Range   Glucose-Capillary 352 (H) 70 - 99 mg/dL    Comment: Glucose reference range applies only to samples taken after fasting for at least 8 hours.  Glucose, capillary     Status: Abnormal   Collection Time: 05/13/20  8:04 AM  Result Value Ref Range   Glucose-Capillary 284 (H) 70 - 99 mg/dL    Comment: Glucose reference range applies only to samples taken after fasting for at least 8 hours.  Glucose, capillary     Status: Abnormal   Collection Time: 05/13/20 11:36 AM  Result Value Ref Range   Glucose-Capillary 290 (H) 70 - 99 mg/dL    Comment: Glucose reference range applies only to samples taken after fasting for at least 8 hours.   Comment 1 Notify RN    Comment 2 Document in Chart   Glucose, capillary     Status: Abnormal   Collection Time: 05/13/20  4:24 PM  Result Value Ref Range   Glucose-Capillary 273 (H) 70 - 99 mg/dL    Comment: Glucose reference range applies only to samples taken after fasting for at least 8 hours.   Comment 1 Notify RN    Comment 2 Document in Chart   Glucose, capillary     Status: Abnormal   Collection Time: 05/13/20  9:05 PM  Result Value Ref  Range   Glucose-Capillary 359 (H) 70 - 99 mg/dL    Comment: Glucose reference range applies only to samples taken after fasting for at least 8 hours.  Glucose, capillary     Status: Abnormal   Collection Time: 05/14/20  6:59 AM  Result Value Ref Range   Glucose-Capillary 247 (H) 70 - 99 mg/dL    Comment: Glucose reference range applies only to samples taken after fasting for at least 8 hours.   Comment 1 Notify RN   Glucose, capillary     Status: Abnormal   Collection Time: 05/14/20 11:23 AM  Result Value Ref Range   Glucose-Capillary 255 (H) 70 - 99 mg/dL    Comment: Glucose reference range applies only to samples taken after fasting for at least 8 hours.    Blood Alcohol level:  Lab Results  Component Value Date   ETH <10 05/11/2020   ETH <10 03/05/2019    Metabolic Disorder Labs: Lab Results  Component Value Date   HGBA1C 11.6 (H) 05/11/2020   MPG 286 05/11/2020   No results found for: PROLACTIN No results found for: CHOL, TRIG, HDL, CHOLHDL, VLDL, LDLCALC  Physical Findings: AIMS:  , ,  ,  ,    CIWA:    COWS:     Musculoskeletal: Strength & Muscle Tone: within normal limits Gait & Station: normal Patient leans: N/A  Psychiatric Specialty Exam: Physical Exam Vitals and nursing note reviewed.  Constitutional:      Appearance: He is well-developed.  HENT:     Head: Normocephalic and atraumatic.  Eyes:     Conjunctiva/sclera: Conjunctivae normal.     Pupils: Pupils are equal, round, and reactive to light.  Cardiovascular:     Heart sounds: Normal heart sounds.  Pulmonary:     Effort: Pulmonary effort is normal.  Abdominal:     Palpations: Abdomen is soft.  Musculoskeletal:        General: Normal range of motion.     Cervical back: Normal range of motion.  Skin:    General: Skin is warm and dry.  Neurological:     General: No focal deficit present.     Mental Status: He is alert.  Psychiatric:        Attention and Perception: Attention normal.         Mood and Affect: Mood normal.        Speech: Speech normal.        Behavior: Behavior normal.        Cognition and Memory: Cognition normal.        Judgment: Judgment normal.     Review of Systems  Constitutional: Negative.   HENT: Negative.   Eyes: Negative.   Respiratory: Negative.   Cardiovascular: Negative.   Gastrointestinal: Negative.   Musculoskeletal: Negative.   Skin: Negative.   Neurological: Negative.   Psychiatric/Behavioral: Negative.     Blood pressure (!) 141/88, pulse 96, temperature 98.4 F (36.9 C), temperature source Oral, resp. rate 17, height 6' (1.829 m), weight (!) 153.3 kg, SpO2 98 %.Body mass index is 45.84 kg/m.  General Appearance: Casual  Eye Contact:  Fair  Speech:  Clear and Coherent  Volume:  Normal  Mood:  Euthymic  Affect:  Congruent  Thought Process:  Goal Directed  Orientation:  Full (Time, Place, and Person)  Thought Content:  Logical  Suicidal Thoughts:  No  Homicidal Thoughts:  No  Memory:  Immediate;   Fair Recent;   Fair Remote;   Fair  Judgement:  Fair  Insight:  Fair  Psychomotor Activity:  Decreased  Concentration:  Concentration: Fair  Recall:  [employer] of Knowledge:  Fair  Language:  Fair  Akathisia:  No  Handed:  Right  AIMS (if indicated):     Assets:  Desire for Improvement Resilience  ADL's:  Intact  Cognition:  WNL  Sleep:  Number of Hours: 6.75     Treatment Plan Summary: Daily contact with patient to assess and evaluate symptoms and progress in treatment, Medication management and Plan Patient has had his diabetes dose adjusted.  Reviewed plan with patient.  At this point mentally stable without any psychosis.  We will most likely be making preparations for discharge tomorrow  Mordecai RasmussenJohn Leanord Thibeau, MD 05/14/2020, 1:26 PM

## 2020-05-14 NOTE — [facility] Group Notes (Signed)
[facility] Group Notes:  (Nursing/MHT/Case Management/Adjunct)  Date:  05/14/2020  Time:  9:15 AM  Type of Therapy:  Community Meeting  Participation Level:  Active  Participation Quality:  Appropriate and Attentive  Affect:  Appropriate  Cognitive:  Appropriate  Insight:  Appropriate  Engagement in Group:  Engaged  Modes of Intervention:  Discussion  Summary of Progress/Problems:  Clint Guy 05/14/2020, 9:15 AM

## 2020-05-14 NOTE — Plan of Care (Signed)
D: Pt alert and oriented x 4. Pt rates depression 0/10, hopelessness 0/10, and anxiety 0/10.Pt goal: "Go Home." Pt reports energy level as normal. Pt reports sleep last night as being poor. Pt did receive medications for sleep. Pt denies experiencing any pain at this time. Pt denies experiencing any SI/HI, or AVH at this time.   A: Scheduled medications administered to pt, per MD orders. Support and encouragement provided. Frequent verbal contact made. Routine safety checks conducted q15 minutes.   R: No adverse drug reactions noted. Pt verbally contracts for safety at this time. Pt complaint with medications and treatment plan. Pt interacts well with others on the unit. Pt remains safe at this time. Will continue to monitor.   Problem: Activity: Goal: Interest or engagement in leisure activities will improve Outcome: Progressing   Problem: Coping: Goal: Will verbalize feelings Outcome: Progressing   Problem: Health Behavior/Discharge Planning: Goal: Compliance with therapeutic regimen will improve Outcome: Progressing   Problem: Activity: Goal: Imbalance in normal sleep/wake cycle will improve Outcome: Not Progressing

## 2020-05-14 NOTE — Progress Notes (Signed)
Patient is pleasant. Much more active on the unit interacting well with others and hanging out in the milieu. He received his prescribed medications and tolerated without incident. He received 5 units of insulin after CBG registered 359. He denies SI/HI/AVH and pain during this encounter.  He rates his anxiety and depression as minimal and his managing his symptoms at this time. H e remain safe on the unit wit 15 minute safety check and informed to contact the staff with any concerns.  Cleo Butler-Nicholson, LPN

## 2020-05-14 NOTE — Progress Notes (Signed)
Recreation Therapy Notes  Date: 05/14/2020  Time: 9:30 am  Location: Courtyard   Behavioral response: Appropriate  Intervention Topic: Emotions     Discussion/Intervention:  Group content on today was focused on emotions. The group identified what emotions are and why it is important to have emotions. Patients expressed some positive and negative emotions. Individuals gave some past experiences on how they normally dealt with emotions in the past. The group described some positive ways to deal with emotions in the future. Patients participated in the intervention "Name the Megan Salon" where individuals were given a chance to experience different emotions.  Clinical Observations/Feedback:  Patient came to group and was actively social with peers and staff while participating in the group intervention.   Alayza Pieper LRT/CTRS         Pauletta Pickney 05/14/2020 11:26 AM

## 2020-05-14 NOTE — Progress Notes (Signed)
Recreation Therapy Notes  INPATIENT RECREATION THERAPY ASSESSMENT  Patient Details Name: Aaron Braun MRN: [id number] DOB: 10-28-1971 Today's Date: 05/14/2020       Information Obtained From: Patient  Able to Participate in Assessment/Interview: Yes  Patient Presentation: Responsive  Reason for Admission (Per Patient): Active Symptoms  Patient Stressors:    Coping Skills:   Talk  Leisure Interests (2+):  Individual - Computer, Chiropractor of Recreation/Participation: Weekly  Awareness of Community Resources:     [employer]:     Current Use:    If no, Barriers?:    Expressed Interest in [employer] Information:    [employer] of Residence:  Film/video editor  Patient Main Form of Transportation: Set designer  Patient Strengths:  Communication  Patient Identified Areas of Improvement:  Getting my medication  Patient Goal for Hospitalization:  Get on medication  Current SI (including self-harm):  No  Current HI:  No  Current AVH: No  Staff Intervention Plan: Group Attendance, Collaborate with Interdisciplinary Treatment Team  Consent to Intern Participation: N/A  Labrina Lines 05/14/2020, 4:26 PM

## 2020-05-14 NOTE — [facility] Group Notes (Signed)
Emotional Regulation °05/14/2020 1PM ° °Type of Therapy/Topic:  Group Therapy:  Emotion Regulation ° °Participation Level:  Did Not Attend  ° °Description of Group:   °The purpose of this group is to assist patients in learning to regulate negative emotions and experience positive emotions. Patients will be guided to discuss ways in which they have been vulnerable to their negative emotions. These vulnerabilities will be juxtaposed with experiences of positive emotions or situations, and patients will be challenged to use positive emotions to combat negative ones. Special emphasis will be placed on coping with negative emotions in conflict situations, and patients will process healthy conflict resolution skills. ° °Therapeutic Goals: °1. Patient will identify two positive emotions or experiences to reflect on in order to balance out negative emotions °2. Patient will label two or more emotions that they find the most difficult to experience °3. Patient will demonstrate positive conflict resolution skills through discussion and/or role plays ° °Summary of Patient Progress: ° ° ° ° ° ° °Therapeutic Modalities:   °Cognitive Behavioral Therapy °Feelings Identification °Dialectical Behavioral Therapy ° ° °Anjelika Ausburn T Caprice Wasko, LCSW °05/14/2020 2:23 PM ° °

## 2020-05-15 LAB — GLUCOSE, CAPILLARY: Glucose-Capillary: 218 mg/dL — ABNORMAL HIGH (ref 70–99)

## 2020-05-15 MED ORDER — DIVALPROEX SODIUM 500 MG PO DR TAB: 500.0000 mg | DELAYED_RELEASE_TABLET | Freq: Two times a day (BID) | ORAL | 1 refills | Status: AC

## 2020-05-15 NOTE — Progress Notes (Signed)
  [facility] Adult Case Management Discharge Plan :  Will you be returning to the same living situation after discharge:  Yes,  homeless At discharge, do you have transportation home?: Yes,  patient own car. Do you have the ability to pay for your medications: Yes,  patient has means to acquire medications  Release of information consent forms completed and in the chart;  Patient's signature needed at discharge.  Patient to Follow up at: [facility] [address] [place], [place] [address]  phone [phone] fax-[phone]. Patient's appointment is on 05/20/2020   Next level of care provider has access to [facility] Link:yes  Safety Planning and Suicide Prevention discussed: Yes,  with mother [place].     Has patient been referred to the Quitline?: N/A patient is not a smoker  Patient has been referred for addiction treatment: N/A  Evorn Gong, LCSW 05/15/2020, 3:08 PM

## 2020-05-15 NOTE — [facility] Suicide Risk Assessment (Signed)
[facility] Discharge Suicide Risk Assessment   Principal Problem: Bipolar depression (HCC) Discharge Diagnoses: Principal Problem:   Bipolar depression (HCC) Active Problems:   Uncontrolled type 2 diabetes mellitus with hyperglycemia (HCC)   Mixed hyperlipidemia   Essential hypertension, benign   Total Time spent with patient: 30 minutes  Musculoskeletal: Strength & Muscle Tone: within normal limits Gait & Station: normal Patient leans: N/A  Psychiatric Specialty Exam: Review of Systems  Constitutional: Negative.   HENT: Negative.   Eyes: Negative.   Respiratory: Negative.   Cardiovascular: Negative.   Gastrointestinal: Negative.   Musculoskeletal: Negative.   Skin: Negative.   Neurological: Negative.   Psychiatric/Behavioral: Negative.     Blood pressure (!) 127/94, pulse 93, temperature 97.6 F (36.4 C), temperature source Oral, resp. rate 17, height 6' (1.829 m), weight (!) 153.3 kg, SpO2 99 %.Body mass index is 45.84 kg/m.  General Appearance: Casual  Eye Contact::  Good  Speech:  Clear and Coherent409  Volume:  Normal  Mood:  Euthymic  Affect:  Congruent  Thought Process:  Coherent  Orientation:  Full (Time, Place, and Person)  Thought Content:  Logical  Suicidal Thoughts:  No  Homicidal Thoughts:  No  Memory:  Immediate;   Fair Recent;   Fair Remote;   Fair  Judgement:  Fair  Insight:  Fair  Psychomotor Activity:  Normal  Concentration:  Fair  Recall:  [employer] of Knowledge:Fair  Language: Fair  Akathisia:  No  Handed:  Right  AIMS (if indicated):     Assets:  Desire for Improvement  Sleep:  Number of Hours: 6.5  Cognition: WNL  ADL's:  Intact   Mental Status Per Nursing Assessment::   On Admission:  Suicidal ideation indicated by others, NA  Demographic Factors:  Male, Low socioeconomic status and Living alone  Loss Factors: Decline in physical health  Historical Factors: NA  Risk Reduction Factors:   Positive coping skills or problem  solving skills  Continued Clinical Symptoms:  Bipolar Disorder:   Mixed State  Cognitive Features That Contribute To Risk:  None    Suicide Risk:  Minimal: No identifiable suicidal ideation.  Patients presenting with no risk factors but with morbid ruminations; may be classified as minimal risk based on the severity of the depressive symptoms    Plan Of Care/Follow-up recommendations:  Activity:  Activity as tolerated Diet:  Diabetic diet Other:  Follow-up with outpatient treatment as indicated previously.  Continue current medicine.  Mordecai Rasmussen, MD 05/15/2020, 11:05 AM

## 2020-05-15 NOTE — Discharge Summary (Signed)
Physician Discharge Summary Note  Patient:  Aaron Braun is an 49 y.o., male MRN:  [id number] DOB:  08/30/1971 Patient phone:  [phone] (home)  Patient address:   [place] [address],  Total Time spent with patient: 30 minutes  Date of Admission:  05/12/2020 Date of Discharge: 05/15/2020  Reason for Admission: Admitted after presentation to the emergency room reporting his bipolar disorder gotten worse with some passive suicidal thoughts  Principal Problem: Bipolar depression ([place]) Discharge Diagnoses: Principal Problem:   Bipolar depression ([place]) Active Problems:   Uncontrolled type 2 diabetes mellitus with hyperglycemia ([place])   Mixed hyperlipidemia   Essential hypertension, benign   Past Psychiatric History: History of bipolar disorder  Past Medical History:  Past Medical History:  Diagnosis Date  . Diabetes mellitus without complication ([place])   . Hypertension    History reviewed. No pertinent surgical history. Family History:  Family History  Problem Relation Age of Onset  . Diabetes Mother   . Thyroid disease Mother   . Hypertension Mother   . Hyperlipidemia Mother   . CAD Mother   . Kidney disease Mother   . Stroke Mother   . Heart attack Mother   . Diabetes Father   . Hypertension Father   . Hyperlipidemia Father   . Heart attack Father   . CAD Father    Family Psychiatric  History: See previous Social History:  Social History   Substance and Sexual Activity  Alcohol Use Never     Social History   Substance and Sexual Activity  Drug Use Never    Social History   Socioeconomic History  . Marital status: Legally Separated    Spouse name: Not on file  . Number of children: Not on file  . Years of education: Not on file  . Highest education level: Not on file  Occupational History  . Not on file  Tobacco Use  . Smoking status: Current Every Day Smoker    Packs/day: 1.00    Years: 35.00    Pack years: 35.00     Types: Cigarettes  . Smokeless tobacco: Never Used  Vaping Use  . Vaping Use: Never used  Substance and Sexual Activity  . Alcohol use: Never  . Drug use: Never  . Sexual activity: Not on file  Other Topics Concern  . Not on file  Social History Narrative  . Not on file   Social Determinants of Health   Financial Resource Strain:   . Difficulty of Paying Living Expenses:   Food Insecurity:   . Worried About Charity fundraiser in the Last Year:   . Arboriculturist in the Last Year:   Transportation Needs:   . Film/video editor (Medical):   Marland Kitchen Lack of Transportation (Non-Medical):   Physical Activity:   . Days of Exercise per Week:   . Minutes of Exercise per Session:   Stress:   . Feeling of Stress :   Social Connections:   . Frequency of Communication with Friends and Family:   . Frequency of Social Gatherings with Friends and Family:   . Attends Religious Services:   . Active Member of Clubs or Organizations:   . Attends Archivist Meetings:   Marland Kitchen Marital Status:     Hospital Course: Admitted to psychiatric unit.  15-minute checks maintained.  Patient displayed no dangerous behavior in the hospital.  He was cooperative with treatment planning and with medication.  Blood  sugars remained elevated and some adjustment was made to his diabetes medicine with the assistance of the diabetic coordinator.  Otherwise he had no physical complaints.  Interacted well on the unit.  Denies any suicidal thoughts.  At the time of discharge he is now on Depakote 500 mg twice a day and tolerating it fine.  States he intends to go stay in his car again for a few days until he gets his check and then has plans to get an apartment or a rooming house room and follow-up with local outpatient treatment.  No sign of acute dangerousness at discharge.  Physical Findings: AIMS:  , ,  ,  ,    CIWA:    COWS:     Musculoskeletal: Strength & Muscle Tone: within normal limits Gait &  Station: normal Patient leans: N/A  Psychiatric Specialty Exam: Physical Exam Vitals and nursing note reviewed.  Constitutional:      Appearance: He is well-developed.  HENT:     Head: Normocephalic and atraumatic.  Eyes:     Conjunctiva/sclera: Conjunctivae normal.     Pupils: Pupils are equal, round, and reactive to light.  Cardiovascular:     Heart sounds: Normal heart sounds.  Pulmonary:     Effort: Pulmonary effort is normal.  Abdominal:     Palpations: Abdomen is soft.  Musculoskeletal:        General: Normal range of motion.     Cervical back: Normal range of motion.  Skin:    General: Skin is warm and dry.  Neurological:     General: No focal deficit present.     Mental Status: He is alert.  Psychiatric:        Mood and Affect: Mood normal.     Review of Systems  Constitutional: Negative.   HENT: Negative.   Eyes: Negative.   Respiratory: Negative.   Cardiovascular: Negative.   Gastrointestinal: Negative.   Musculoskeletal: Negative.   Skin: Negative.   Neurological: Negative.   Psychiatric/Behavioral: Negative.     Blood pressure (!) 127/94, pulse 93, temperature 97.6 F (36.4 C), temperature source Oral, resp. rate 17, height 6' (1.829 m), weight (!) 153.3 kg, SpO2 99 %.Body mass index is 45.84 kg/m.  General Appearance: Casual  Eye Contact:  Good  Speech:  Clear and Coherent  Volume:  Normal  Mood:  Euthymic  Affect:  Congruent  Thought Process:  Coherent  Orientation:  Full (Time, Place, and Person)  Thought Content:  Logical  Suicidal Thoughts:  No  Homicidal Thoughts:  No  Memory:  Immediate;   Fair Recent;   Fair Remote;   Fair  Judgement:  Fair  Insight:  Fair  Psychomotor Activity:  Normal  Concentration:  Concentration: Fair  Recall:  [employer] of Knowledge:  Fair  Language:  Fair  Akathisia:  No  Handed:  Right  AIMS (if indicated):     Assets:  Desire for Improvement Resilience  ADL's:  Intact  Cognition:  WNL  Sleep:   Number of Hours: 6.5        Has this patient used any form of tobacco in the last 30 days? (Cigarettes, Smokeless Tobacco, Cigars, and/or Pipes) Yes, No  Blood Alcohol level:  Lab Results  Component Value Date   ETH <10 05/11/2020   ETH <10 15/40/0867    Metabolic Disorder Labs:  Lab Results  Component Value Date   HGBA1C 11.6 (H) 05/11/2020   MPG 286 05/11/2020   No results found for:  PROLACTIN No results found for: CHOL, TRIG, HDL, CHOLHDL, VLDL, LDLCALC  See Psychiatric Specialty Exam and Suicide Risk Assessment completed by Attending Physician prior to discharge.  Discharge destination:  Home  Is patient on multiple antipsychotic therapies at discharge:  No   Has Patient had three or more failed trials of antipsychotic monotherapy by history:  No  Recommended Plan for Multiple Antipsychotic Therapies: NA  Discharge Instructions    Diet - low sodium heart healthy   Complete by: As directed    Increase activity slowly   Complete by: As directed      Allergies as of 05/15/2020      Reactions   Other Nausea And Vomiting   Mushrooms      Medication List    TAKE these medications     Indication  Accu-Chek Guide test strip Generic drug: glucose blood Use as instructed 4 x daily. E11.65  Indication: Diabetes   Accu-Chek Guide w/Device Kit 1 each by Does not apply route 4 (four) times daily.  Indication: Diabetes   atorvastatin 20 MG tablet Commonly known as: LIPITOR Take 20 mg by mouth daily.  Indication: High Amount of Fats in the Blood   divalproex 500 MG DR tablet Commonly known as: DEPAKOTE Take 1 tablet (500 mg total) by mouth every 12 (twelve) hours.  Indication: Depressive Phase of Manic-Depression   Lantus SoloStar 100 UNIT/ML Solostar Pen Generic drug: insulin glargine Inject 30 Units into the skin at bedtime.  Indication: Type 2 Diabetes   Levemir FlexTouch 100 UNIT/ML FlexPen Generic drug: insulin detemir Inject 60 Units into the skin  at bedtime.  Indication: Type 2 Diabetes   loratadine 10 MG tablet Commonly known as: CLARITIN Take 10 mg by mouth daily.  Indication: Itching Nose   losartan 50 MG tablet Commonly known as: COZAAR Take 50 mg by mouth daily.  Indication: High Blood Pressure Disorder   mometasone 50 MCG/ACT nasal spray Commonly known as: NASONEX Place 2 sprays into the nose daily as needed (allergies).  Indication: Hayfever   NovoLOG FlexPen 100 UNIT/ML FlexPen Generic drug: insulin aspart Inject 5 Units into the skin 3 (three) times daily.  Indication: Type 2 Diabetes   omeprazole 20 MG capsule Commonly known as: PRILOSEC Take 20 mg by mouth daily.  Indication: Gastroesophageal Reflux Disease   Pen Needles 31G X 8 MM Misc 1 each by Does not apply route at bedtime.  Indication: Diabetes   sitaGLIPtin 50 MG tablet Commonly known as: Januvia Take 1 tablet (50 mg total) by mouth daily.  Indication: Type 2 Diabetes   Victoza 18 MG/3ML Sopn Generic drug: liraglutide Inject 1.2 mg into the skin daily.  Indication: Diabetes        Follow-up recommendations:  Activity:  Activity as tolerated Diet:  Diabetic diet Other:  Follow-up with outpatient mental health treatment as recommended  Comments: Prescription provided for Depakote at discharge.  Patient states he has prescriptions for all of his other medicines and does not need new prescriptions  Signed: Alethia Berthold, MD 05/15/2020, 11:21 AM

## 2020-05-15 NOTE — Progress Notes (Signed)
Patient alert and oriented x 4, affect is blunted , thoughts are organized and complaint, he is interacting appropriately with peers and staff no distress noted, he attended evening wrap up group and was compliant with medication regimen, 15 minutes safety checks maintained will continue to monitor.

## 2020-05-15 NOTE — Progress Notes (Signed)
Patient was provided with discharge summary, Transition packet, and Suicide Risk Assessment. Verbalized discharge summary, transition packet and suicide risk assessment, patient made aware of any change in medications, and all upcoming appointments.   Patient belongings returned. Rx provided to patient.  Patient denied any SI, denies plans for self harm or the harm of others. Patient is calm, with appropriate affect and eye contact. Patient reports no complaints at this time.  

## 2020-05-15 NOTE — [facility] Suicide Risk Assessment (Signed)
[facility] INPATIENT:  Family/Significant Other Suicide Prevention Education  Suicide Prevention Education:  Education Completed; Silvano Bilis, (Mother),has been identified by the patient as the family member/significant other with whom the patient will be residing, and identified as the person(s) who will aid the patient in the event of a mental health crisis (suicidal ideations/suicide attempt).  With written consent from the patient, the family member/significant other has been provided the following suicide prevention education, prior to the and/or following the discharge of the patient.  The suicide prevention education provided includes the following:  Suicide risk factors  Suicide prevention and interventions  National Suicide Hotline telephone number  [facility] assessment telephone number  [facility] Emergency Assistance 911  [facility] and/or Residential Mobile Crisis Unit telephone number  Request made of family/significant other to:  Remove weapons (e.g., guns, rifles, knives), all items previously/currently identified as safety concern.    Remove drugs/medications (over-the-counter, prescriptions, illicit drugs), all items previously/currently identified as a safety concern.  The family member/significant other verbalizes understanding of the suicide prevention education information provided.  The family member/significant other agrees to remove the items of safety concern listed above.  Ms. Nikolai stated that her son has no where to go and that he cannot stay with her due to her living at a senior citizen residents. Ms. Schiefelbein stated her son has been in and out of facilities and knows what to say to get out. Ms. Ivins believes her son should not be discharged yet. Ms. Parrillo stated her son does have access to a BB gun but no other weapons.    Carollee Herter  Rykker Coviello LCSWA 05/15/2020, 11:50 AM

## 2020-05-15 NOTE — Plan of Care (Signed)
  Problem: Education: Goal: Utilization of techniques to improve thought processes will improve Outcome: Adequate for Discharge Goal: Knowledge of the prescribed therapeutic regimen will improve Outcome: Adequate for Discharge   Problem: Activity: Goal: Interest or engagement in leisure activities will improve Outcome: Adequate for Discharge Goal: Imbalance in normal sleep/wake cycle will improve Outcome: Adequate for Discharge   Problem: Coping: Goal: Coping ability will improve Outcome: Adequate for Discharge Goal: Will verbalize feelings Outcome: Adequate for Discharge   Problem: Health Behavior/Discharge Planning: Goal: Ability to make decisions will improve Outcome: Adequate for Discharge Goal: Compliance with therapeutic regimen will improve Outcome: Adequate for Discharge   Problem: Role Relationship: Goal: Will demonstrate positive changes in social behaviors and relationships Outcome: Adequate for Discharge   Problem: Safety: Goal: Ability to disclose and discuss suicidal ideas will improve Outcome: Adequate for Discharge Goal: Ability to identify and utilize support systems that promote safety will improve Outcome: Adequate for Discharge   Problem: Self-Concept: Goal: Will verbalize positive feelings about self Outcome: Adequate for Discharge Goal: Level of anxiety will decrease Outcome: Adequate for Discharge   Problem: Education: Goal: Ability to make informed decisions regarding treatment will improve Outcome: Adequate for Discharge   Problem: Coping: Goal: Coping ability will improve Outcome: Adequate for Discharge   Problem: Health Behavior/Discharge Planning: Goal: Identification of resources available to assist in meeting health care needs will improve Outcome: Adequate for Discharge   Problem: Medication: Goal: Compliance with prescribed medication regimen will improve Outcome: Adequate for Discharge   Problem: Self-Concept: Goal: Ability to  disclose and discuss suicidal ideas will improve Outcome: Adequate for Discharge Goal: Will verbalize positive feelings about self Outcome: Adequate for Discharge   Problem: Education: Goal: Knowledge of [facility] Education information/materials will improve Outcome: Adequate for Discharge Goal: Emotional status will improve Outcome: Adequate for Discharge Goal: Mental status will improve Outcome: Adequate for Discharge Goal: Verbalization of understanding the information provided will improve Outcome: Adequate for Discharge   Problem: Activity: Goal: Interest or engagement in activities will improve Outcome: Adequate for Discharge Goal: Sleeping patterns will improve Outcome: Adequate for Discharge   Problem: Coping: Goal: Ability to verbalize frustrations and anger appropriately will improve Outcome: Adequate for Discharge Goal: Ability to demonstrate self-control will improve Outcome: Adequate for Discharge   Problem: Health Behavior/Discharge Planning: Goal: Identification of resources available to assist in meeting health care needs will improve Outcome: Adequate for Discharge Goal: Compliance with treatment plan for underlying cause of condition will improve Outcome: Adequate for Discharge   Problem: Physical Regulation: Goal: Ability to maintain clinical measurements within normal limits will improve Outcome: Adequate for Discharge   Problem: Safety: Goal: Periods of time without injury will increase Outcome: Adequate for Discharge

## 2020-06-11 DIAGNOSIS — Z794 Long term (current) use of insulin: Secondary | ICD-10-CM | POA: Diagnosis not present

## 2020-06-11 DIAGNOSIS — E1165 Type 2 diabetes mellitus with hyperglycemia: Secondary | ICD-10-CM | POA: Diagnosis not present

## 2020-06-11 DIAGNOSIS — R238 Other skin changes: Secondary | ICD-10-CM | POA: Diagnosis not present

## 2020-06-14 DIAGNOSIS — Z6841 Body Mass Index (BMI) 40.0 and over, adult: Secondary | ICD-10-CM | POA: Diagnosis not present

## 2020-06-14 DIAGNOSIS — M2392 Unspecified internal derangement of left knee: Secondary | ICD-10-CM | POA: Diagnosis not present

## 2020-06-14 DIAGNOSIS — G8929 Other chronic pain: Secondary | ICD-10-CM | POA: Diagnosis not present

## 2020-06-14 DIAGNOSIS — M25562 Pain in left knee: Secondary | ICD-10-CM | POA: Diagnosis not present

## 2020-06-14 DIAGNOSIS — M1732 Unilateral post-traumatic osteoarthritis, left knee: Secondary | ICD-10-CM | POA: Diagnosis not present

## 2020-07-02 DIAGNOSIS — F17200 Nicotine dependence, unspecified, uncomplicated: Secondary | ICD-10-CM | POA: Diagnosis not present

## 2020-07-02 DIAGNOSIS — E1165 Type 2 diabetes mellitus with hyperglycemia: Secondary | ICD-10-CM | POA: Diagnosis not present

## 2020-07-02 DIAGNOSIS — I10 Essential (primary) hypertension: Secondary | ICD-10-CM | POA: Diagnosis not present

## 2020-07-02 DIAGNOSIS — E782 Mixed hyperlipidemia: Secondary | ICD-10-CM | POA: Diagnosis not present

## 2020-07-02 DIAGNOSIS — Z6841 Body Mass Index (BMI) 40.0 and over, adult: Secondary | ICD-10-CM | POA: Diagnosis not present

## 2020-07-04 ENCOUNTER — Emergency Department: Payer: Medicare HMO

## 2020-07-04 ENCOUNTER — Emergency Department
Admission: EM | Admit: 2020-07-04 | Discharge: 2020-07-04 | Disposition: A | Discharge status: Home / Self Care | Payer: Medicare HMO | Attending: Emergency Medicine | Admitting: Emergency Medicine

## 2020-07-04 ENCOUNTER — Other Ambulatory Visit: Payer: Self-pay

## 2020-07-04 DIAGNOSIS — R519 Headache, unspecified: Secondary | ICD-10-CM | POA: Insufficient documentation

## 2020-07-04 DIAGNOSIS — R0602 Shortness of breath: Secondary | ICD-10-CM | POA: Diagnosis not present

## 2020-07-04 DIAGNOSIS — G4489 Other headache syndrome: Secondary | ICD-10-CM | POA: Diagnosis not present

## 2020-07-04 DIAGNOSIS — R1110 Vomiting, unspecified: Secondary | ICD-10-CM | POA: Diagnosis not present

## 2020-07-04 DIAGNOSIS — R112 Nausea with vomiting, unspecified: Secondary | ICD-10-CM | POA: Insufficient documentation

## 2020-07-04 DIAGNOSIS — Z5321 Procedure and treatment not carried out due to patient leaving prior to being seen by health care provider: Secondary | ICD-10-CM | POA: Insufficient documentation

## 2020-07-04 DIAGNOSIS — I10 Essential (primary) hypertension: Secondary | ICD-10-CM | POA: Diagnosis not present

## 2020-07-04 DIAGNOSIS — R079 Chest pain, unspecified: Secondary | ICD-10-CM | POA: Diagnosis not present

## 2020-07-04 DIAGNOSIS — R0789 Other chest pain: Secondary | ICD-10-CM | POA: Diagnosis not present

## 2020-07-04 DIAGNOSIS — E1165 Type 2 diabetes mellitus with hyperglycemia: Secondary | ICD-10-CM | POA: Diagnosis not present

## 2020-07-04 LAB — CBC
HCT: 46.6 % (ref 39.0–52.0)
Hemoglobin: 16.2 g/dL (ref 13.0–17.0)
MCH: 30.5 pg (ref 26.0–34.0)
MCHC: 34.8 g/dL (ref 30.0–36.0)
MCV: 87.8 fL (ref 80.0–100.0)
Platelets: 192 10*3/uL (ref 150–400)
RBC: 5.31 MIL/uL (ref 4.22–5.81)
RDW: 12.6 % (ref 11.5–15.5)
WBC: 9.5 10*3/uL (ref 4.0–10.5)
nRBC: 0 % (ref 0.0–0.2)

## 2020-07-04 LAB — BASIC METABOLIC PANEL
Anion gap: 12 (ref 5–15)
BUN: 10 mg/dL (ref 6–20)
CO2: 20 mmol/L — ABNORMAL LOW (ref 22–32)
Calcium: 8.7 mg/dL — ABNORMAL LOW (ref 8.9–10.3)
Chloride: 103 mmol/L (ref 98–111)
Creatinine, Ser: 0.56 mg/dL — ABNORMAL LOW (ref 0.61–1.24)
GFR calc Af Amer: >60 mL/min (ref >60)
GFR calc non Af Amer: >60 mL/min (ref >60)
Glucose, Bld: 386 mg/dL — ABNORMAL HIGH (ref 70–99)
Potassium: 4.2 mmol/L (ref 3.5–5.1)
Sodium: 135 mmol/L (ref 135–145)

## 2020-07-04 LAB — TROPONIN I (HIGH SENSITIVITY): Troponin I (High Sensitivity): 7 ng/L (ref <18)

## 2020-07-04 NOTE — ED Triage Notes (Signed)
Pt comes via POV from home with c/o nausea, CP and vomiting. Pt states bad headache. Pt states he felt like his heart was thumping.  Pt states central CP. Pt states some SOB

## 2020-07-04 NOTE — ED Notes (Addendum)
Brought pt into triage 2 for revital and repeat trop.  Pt states "so Im going to have to wait a few more hours to be seen?  If there was something wrong, they would have brought me back".  I explained the repeat trop to pt and told him yes, he will need to wait for an exam room and we will be monitoring his results.  Pt states "just get me a ride home.  Im leaving.  I came by ambulance and my car is at home and I dont have anyone to get me".  Explained to pt we do not have a transport system in place and I could try calling him a cab.  He states " I dont have money for a cab".  I asked pt to try and call someone for a ride and he jumped out of the wheelchair and said "Im leaving and walking home". 1st nurse notified.  Second trop not done

## 2020-07-06 DIAGNOSIS — R29898 Other symptoms and signs involving the musculoskeletal system: Secondary | ICD-10-CM | POA: Diagnosis not present

## 2020-07-06 DIAGNOSIS — M25562 Pain in left knee: Secondary | ICD-10-CM | POA: Diagnosis not present

## 2020-07-06 DIAGNOSIS — M1732 Unilateral post-traumatic osteoarthritis, left knee: Secondary | ICD-10-CM | POA: Diagnosis not present

## 2020-07-06 DIAGNOSIS — M25662 Stiffness of left knee, not elsewhere classified: Secondary | ICD-10-CM | POA: Diagnosis not present

## 2020-08-03 DIAGNOSIS — G4733 Obstructive sleep apnea (adult) (pediatric): Secondary | ICD-10-CM | POA: Diagnosis not present

## 2020-08-15 DIAGNOSIS — G4733 Obstructive sleep apnea (adult) (pediatric): Secondary | ICD-10-CM | POA: Diagnosis not present

## 2020-09-08 DIAGNOSIS — E782 Mixed hyperlipidemia: Secondary | ICD-10-CM | POA: Diagnosis not present

## 2020-09-08 DIAGNOSIS — I10 Essential (primary) hypertension: Secondary | ICD-10-CM | POA: Diagnosis not present

## 2020-09-08 DIAGNOSIS — F319 Bipolar disorder, unspecified: Secondary | ICD-10-CM | POA: Diagnosis not present

## 2020-09-15 DIAGNOSIS — E11319 Type 2 diabetes mellitus with unspecified diabetic retinopathy without macular edema: Secondary | ICD-10-CM | POA: Diagnosis not present

## 2020-09-15 DIAGNOSIS — Z794 Long term (current) use of insulin: Secondary | ICD-10-CM | POA: Diagnosis not present

## 2020-09-15 DIAGNOSIS — I10 Essential (primary) hypertension: Secondary | ICD-10-CM | POA: Diagnosis not present

## 2020-09-15 DIAGNOSIS — E1165 Type 2 diabetes mellitus with hyperglycemia: Secondary | ICD-10-CM | POA: Diagnosis not present

## 2020-09-15 DIAGNOSIS — Z6841 Body Mass Index (BMI) 40.0 and over, adult: Secondary | ICD-10-CM | POA: Diagnosis not present

## 2020-09-15 DIAGNOSIS — Z23 Encounter for immunization: Secondary | ICD-10-CM | POA: Diagnosis not present

## 2020-09-15 DIAGNOSIS — E782 Mixed hyperlipidemia: Secondary | ICD-10-CM | POA: Diagnosis not present

## 2020-09-15 DIAGNOSIS — F319 Bipolar disorder, unspecified: Secondary | ICD-10-CM | POA: Diagnosis not present

## 2020-09-21 ENCOUNTER — Encounter: Payer: Self-pay | Admitting: *Deleted

## 2020-09-21 ENCOUNTER — Encounter: Payer: Medicare HMO | Attending: Infectious Diseases | Admitting: *Deleted

## 2020-09-21 ENCOUNTER — Other Ambulatory Visit: Payer: Self-pay

## 2020-09-21 VITALS — BP 130/84 | Ht 72.0 in | Wt 351.2 lb

## 2020-09-21 DIAGNOSIS — I10 Essential (primary) hypertension: Secondary | ICD-10-CM | POA: Diagnosis not present

## 2020-09-21 DIAGNOSIS — Z794 Long term (current) use of insulin: Secondary | ICD-10-CM

## 2020-09-21 DIAGNOSIS — E782 Mixed hyperlipidemia: Secondary | ICD-10-CM | POA: Diagnosis not present

## 2020-09-21 DIAGNOSIS — E1165 Type 2 diabetes mellitus with hyperglycemia: Secondary | ICD-10-CM

## 2020-09-21 DIAGNOSIS — F17200 Nicotine dependence, unspecified, uncomplicated: Secondary | ICD-10-CM | POA: Diagnosis not present

## 2020-09-21 NOTE — Progress Notes (Signed)
Diabetes Self-Management Education  Visit Type: First/Initial  Appt. Start Time: 1040 Appt. End Time: 1150  09/21/2020  Mr. Aaron Braun, identified by name and date of birth, is a 49 y.o. male with a diagnosis of Diabetes: Type 2.   ASSESSMENT  Blood pressure 130/84, height 6' (1.829 m), weight (!) 351 lb 3.2 oz (159.3 kg). Body mass index is 47.63 kg/m.   Diabetes Self-Management Education - 09/21/20 1534      Visit Information   Visit Type First/Initial      Initial Visit   Diabetes Type Type 2    Are you currently following a meal plan? No    Are you taking your medications as prescribed? Yes    Date Diagnosed 6 years      Health Coping   How would you rate your overall health? Fair      Psychosocial Assessment   Patient Belief/Attitude about Diabetes Other (comment)   "I hate it, I hate all the watching"   Self-care barriers None    Self-management support Doctor's office;Friends    Patient Concerns Nutrition/Meal planning;Glycemic Control;Medication;Monitoring;Weight Control;Healthy Lifestyle    Special Needs None    Preferred Learning Style Auditory;Visual;Hands on    Learning Readiness Contemplating    How often do you need to have someone help you when you read instructions, pamphlets, or other written materials from your doctor or pharmacy? 1 - Never    What is the last grade level you completed in school? some college      Pre-Education Assessment   Patient understands the diabetes disease and treatment process. Needs Review    Patient understands incorporating nutritional management into lifestyle. Needs Instruction    Patient undertands incorporating physical activity into lifestyle. Needs Instruction    Patient understands using medications safely. Needs Review    Patient understands monitoring blood glucose, interpreting and using results Needs Review    Patient understands prevention, detection, and treatment of acute complications. Needs  Instruction    Patient understands prevention, detection, and treatment of chronic complications. Needs Review    Patient understands how to develop strategies to address psychosocial issues. Needs Instruction    Patient understands how to develop strategies to promote health/change behavior. Needs Instruction      Complications   Last HgB A1C per patient/outside source 10.6 %   07/02/2020   How often do you check your blood sugar? 3-4 times/day    Fasting Blood glucose range (mg/dL) >161   He reports all readings 300-430 mg/dL   Postprandial Blood glucose range (mg/dL) >096    Have you had a dilated eye exam in the past 12 months? Yes    Have you had a dental exam in the past 12 months? Yes    Are you checking your feet? Yes    How many days per week are you checking your feet? 7      Dietary Intake   Breakfast Pt lives in boarding home and uses microwave or electric griddle for meals - sausage links; eggs and bacon; biscuits and gravy, grits; waffle    Snack (morning) snacks on chips throughout the day    Lunch skips or eats sandwich - bologna or PB & J; hot dog in chili; wraps with tuna or peanut butter    Dinner beef, chicken, tv dinner with [place] steak and potatoes; spaghetti in can; peas, beans, corn, green beans - "I don't like rabbit food"    Beverage(s) water, sugar free sports drinks  Exercise   Exercise Type ADL's      Patient Education   Previous Diabetes Education No    Disease state  Definition of diabetes, type 1 and 2, and the diagnosis of diabetes;Explored patient's options for treatment of their diabetes    Nutrition management  Role of diet in the treatment of diabetes and the relationship between the three main macronutrients and blood glucose level;Food label reading, portion sizes and measuring food.;Reviewed blood glucose goals for pre and post meals and how to evaluate the patients' food intake on their blood glucose level.    Physical activity and exercise   Role of exercise on diabetes management, blood pressure control and cardiac health.    Medications Taught/reviewed insulin injection, site rotation, insulin storage and needle disposal.;Reviewed patients medication for diabetes, action, purpose, timing of dose and side effects.    Monitoring Purpose and frequency of SMBG.;Taught/discussed recording of test results and interpretation of SMBG.;Identified appropriate SMBG and/or A1C goals.;Other (comment)   Discussed options of CGM - he will see endo today and inquire   Acute complications Taught treatment of hypoglycemia - the 15 rule.    Chronic complications Relationship between chronic complications and blood glucose control    Psychosocial adjustment Identified and addressed patients feelings and concerns about diabetes      Individualized Goals (developed by patient)   Reducing Risk Other (comment)   improve blood sugars, decrease medications, prevent diabetes complications, lose weight, lead a healthier lifestyle, become more fit     Outcomes   Expected Outcomes Demonstrated interest in learning. Expect positive outcomes    Future DMSE 2 wks           Individualized Plan for Diabetes Self-Management Training:   Learning Objective:  Patient will have a greater understanding of diabetes self-management. Patient education plan is to attend individual and/or group sessions per assessed needs and concerns.   Plan:   Patient Instructions  Check blood sugars 4 x day before each meal and before bed every day Bring blood sugar records to the next class Exercise: Begin walking for  10-15  minutes  3  days a week and gradually increase to 30 minute 5 x week Eat 3 meals day,  2  snacks a day Space meals 4-6 hours apart Don't skip meals Carry fast acting glucose and a snack at all times Rotate injection sites  Expected Outcomes:  Demonstrated interest in learning. Expect positive outcomes  Education material provided:  General Meal  Planning Guidelines Simple Meal Plan Glucose tablets Symptoms, causes and treatments of Hypoglycemia  If problems or questions, patient to contact team via:  Sharion Settler, RN, CCM, CDCES [phone]  Future DSME appointment: 2 wks  October 11, 2020 for Diabetes Class 1

## 2020-09-21 NOTE — Patient Instructions (Signed)
Check blood sugars 4 x day before each meal and before bed every day Bring blood sugar records to the next class  Exercise: Begin walking for  10-15  minutes  3  days a week and gradually increase to 30 minute 5 x week  Eat 3 meals day,  2  snacks a day Space meals 4-6 hours apart Don't skip meals  Carry fast acting glucose and a snack at all times Rotate injection sites  Return for Aaron Braun on:

## 2020-09-23 DIAGNOSIS — E1165 Type 2 diabetes mellitus with hyperglycemia: Secondary | ICD-10-CM | POA: Diagnosis not present

## 2020-10-07 DIAGNOSIS — G4733 Obstructive sleep apnea (adult) (pediatric): Secondary | ICD-10-CM | POA: Diagnosis not present

## 2020-10-08 DIAGNOSIS — G4733 Obstructive sleep apnea (adult) (pediatric): Secondary | ICD-10-CM | POA: Diagnosis not present

## 2020-10-11 ENCOUNTER — Encounter: Payer: Medicare HMO | Attending: Infectious Diseases

## 2020-10-11 ENCOUNTER — Other Ambulatory Visit: Payer: Self-pay

## 2020-10-11 DIAGNOSIS — E1165 Type 2 diabetes mellitus with hyperglycemia: Secondary | ICD-10-CM | POA: Insufficient documentation

## 2020-10-13 DIAGNOSIS — F319 Bipolar disorder, unspecified: Secondary | ICD-10-CM | POA: Diagnosis not present

## 2020-10-13 DIAGNOSIS — E1165 Type 2 diabetes mellitus with hyperglycemia: Secondary | ICD-10-CM | POA: Diagnosis not present

## 2020-10-15 DIAGNOSIS — F319 Bipolar disorder, unspecified: Secondary | ICD-10-CM | POA: Diagnosis not present

## 2020-10-15 DIAGNOSIS — G4733 Obstructive sleep apnea (adult) (pediatric): Secondary | ICD-10-CM | POA: Diagnosis not present

## 2020-10-15 DIAGNOSIS — F17200 Nicotine dependence, unspecified, uncomplicated: Secondary | ICD-10-CM | POA: Diagnosis not present

## 2020-10-15 DIAGNOSIS — E782 Mixed hyperlipidemia: Secondary | ICD-10-CM | POA: Diagnosis not present

## 2020-10-15 DIAGNOSIS — E1165 Type 2 diabetes mellitus with hyperglycemia: Secondary | ICD-10-CM | POA: Diagnosis not present

## 2020-10-15 DIAGNOSIS — E11319 Type 2 diabetes mellitus with unspecified diabetic retinopathy without macular edema: Secondary | ICD-10-CM | POA: Diagnosis not present

## 2020-10-15 DIAGNOSIS — Z794 Long term (current) use of insulin: Secondary | ICD-10-CM | POA: Diagnosis not present

## 2020-10-15 DIAGNOSIS — I10 Essential (primary) hypertension: Secondary | ICD-10-CM | POA: Diagnosis not present

## 2020-10-18 ENCOUNTER — Ambulatory Visit: Payer: Medicare HMO

## 2020-10-18 ENCOUNTER — Telehealth: Payer: Self-pay | Admitting: *Deleted

## 2020-10-18 NOTE — Telephone Encounter (Signed)
Phone call to follow up on Diabetes classes. He missed his 1st class last week. He reports that he started working for [employer] and is not able to come to morning or evening classes. Informed him that his referral was good for a year and to call back if he changes his mind. Will discharge.

## 2020-10-21 ENCOUNTER — Encounter: Payer: Self-pay | Admitting: *Deleted

## 2020-10-23 DIAGNOSIS — E1165 Type 2 diabetes mellitus with hyperglycemia: Secondary | ICD-10-CM | POA: Diagnosis not present

## 2020-10-25 ENCOUNTER — Ambulatory Visit: Payer: Medicare HMO

## 2020-11-07 DIAGNOSIS — G4733 Obstructive sleep apnea (adult) (pediatric): Secondary | ICD-10-CM | POA: Diagnosis not present

## 2020-11-11 DIAGNOSIS — F4312 Post-traumatic stress disorder, chronic: Secondary | ICD-10-CM | POA: Diagnosis not present

## 2020-11-17 DIAGNOSIS — J208 Acute bronchitis due to other specified organisms: Secondary | ICD-10-CM | POA: Diagnosis not present

## 2020-11-17 DIAGNOSIS — U071 COVID-19: Secondary | ICD-10-CM | POA: Diagnosis not present

## 2020-11-17 DIAGNOSIS — I517 Cardiomegaly: Secondary | ICD-10-CM | POA: Diagnosis not present

## 2020-11-17 DIAGNOSIS — Z6841 Body Mass Index (BMI) 40.0 and over, adult: Secondary | ICD-10-CM | POA: Diagnosis not present

## 2020-11-17 DIAGNOSIS — J209 Acute bronchitis, unspecified: Secondary | ICD-10-CM | POA: Diagnosis not present

## 2020-11-23 ENCOUNTER — Encounter: Payer: Self-pay | Admitting: Emergency Medicine

## 2020-11-23 ENCOUNTER — Emergency Department
Admission: EM | Admit: 2020-11-23 | Discharge: 2020-11-23 | Disposition: A | Discharge status: Home / Self Care | Payer: Medicare HMO | Attending: Student in an Organized Health Care Education/Training Program | Admitting: Student in an Organized Health Care Education/Training Program

## 2020-11-23 ENCOUNTER — Emergency Department: Payer: Medicare HMO

## 2020-11-23 ENCOUNTER — Other Ambulatory Visit: Payer: Self-pay

## 2020-11-23 DIAGNOSIS — E1165 Type 2 diabetes mellitus with hyperglycemia: Secondary | ICD-10-CM | POA: Diagnosis not present

## 2020-11-23 DIAGNOSIS — Z7984 Long term (current) use of oral hypoglycemic drugs: Secondary | ICD-10-CM | POA: Insufficient documentation

## 2020-11-23 DIAGNOSIS — Z79899 Other long term (current) drug therapy: Secondary | ICD-10-CM | POA: Diagnosis not present

## 2020-11-23 DIAGNOSIS — L03213 Periorbital cellulitis: Secondary | ICD-10-CM | POA: Diagnosis not present

## 2020-11-23 DIAGNOSIS — F17210 Nicotine dependence, cigarettes, uncomplicated: Secondary | ICD-10-CM | POA: Diagnosis not present

## 2020-11-23 DIAGNOSIS — H05012 Cellulitis of left orbit: Secondary | ICD-10-CM | POA: Diagnosis not present

## 2020-11-23 DIAGNOSIS — Z794 Long term (current) use of insulin: Secondary | ICD-10-CM | POA: Insufficient documentation

## 2020-11-23 DIAGNOSIS — R220 Localized swelling, mass and lump, head: Secondary | ICD-10-CM | POA: Diagnosis present

## 2020-11-23 DIAGNOSIS — I10 Essential (primary) hypertension: Secondary | ICD-10-CM | POA: Diagnosis not present

## 2020-11-23 LAB — CBC WITH DIFFERENTIAL/PLATELET
Abs Immature Granulocytes: 0.09 10*3/uL — ABNORMAL HIGH (ref 0.00–0.07)
Basophils Absolute: 0.1 10*3/uL (ref 0.0–0.1)
Basophils Relative: 1 %
Eosinophils Absolute: 0.2 10*3/uL (ref 0.0–0.5)
Eosinophils Relative: 2 %
HCT: 43.5 % (ref 39.0–52.0)
Hemoglobin: 14.7 g/dL (ref 13.0–17.0)
Immature Granulocytes: 1 %
Lymphocytes Relative: 23 %
Lymphs Abs: 2.7 10*3/uL (ref 0.7–4.0)
MCH: 30.5 pg (ref 26.0–34.0)
MCHC: 33.8 g/dL (ref 30.0–36.0)
MCV: 90.2 fL (ref 80.0–100.0)
Monocytes Absolute: 0.9 10*3/uL (ref 0.1–1.0)
Monocytes Relative: 7 %
Neutro Abs: 7.9 10*3/uL — ABNORMAL HIGH (ref 1.7–7.7)
Neutrophils Relative %: 66 %
Platelets: 173 10*3/uL (ref 150–400)
RBC: 4.82 MIL/uL (ref 4.22–5.81)
RDW: 13.4 % (ref 11.5–15.5)
WBC: 11.8 10*3/uL — ABNORMAL HIGH (ref 4.0–10.5)
nRBC: 0 % (ref 0.0–0.2)

## 2020-11-23 LAB — COMPREHENSIVE METABOLIC PANEL
ALT: 27 U/L (ref 0–44)
AST: 21 U/L (ref 15–41)
Albumin: 3.4 g/dL — ABNORMAL LOW (ref 3.5–5.0)
Alkaline Phosphatase: 42 U/L (ref 38–126)
Anion gap: 11 (ref 5–15)
BUN: 24 mg/dL — ABNORMAL HIGH (ref 6–20)
CO2: 23 mmol/L (ref 22–32)
Calcium: 8 mg/dL — ABNORMAL LOW (ref 8.9–10.3)
Chloride: 103 mmol/L (ref 98–111)
Creatinine, Ser: 0.55 mg/dL — ABNORMAL LOW (ref 0.61–1.24)
GFR, Estimated: >60 mL/min (ref >60)
Glucose, Bld: 317 mg/dL — ABNORMAL HIGH (ref 70–99)
Potassium: 4.2 mmol/L (ref 3.5–5.1)
Sodium: 137 mmol/L (ref 135–145)
Total Bilirubin: 0.8 mg/dL (ref 0.3–1.2)
Total Protein: 6.3 g/dL — ABNORMAL LOW (ref 6.5–8.1)

## 2020-11-23 MED ORDER — CLINDAMYCIN PHOSPHATE 600 MG/50ML IV SOLN
600.0000 mg | Freq: Once | INTRAVENOUS | Status: AC
  Administered 2020-11-23: 600 mg via INTRAVENOUS
  Filled 2020-11-23: qty 50

## 2020-11-23 MED ORDER — IOHEXOL 300 MG/ML  SOLN
75.0000 mL | Freq: Once | INTRAMUSCULAR | Status: AC | PRN
  Administered 2020-11-23: 75 mL via INTRAVENOUS
  Filled 2020-11-23: qty 75

## 2020-11-23 MED ORDER — AMOXICILLIN-POT CLAVULANATE 875-125 MG PO TABS: 1.0000 | ORAL_TABLET | Freq: Two times a day (BID) | ORAL | 0 refills | Status: AC

## 2020-11-23 MED ORDER — HYDROCODONE-ACETAMINOPHEN 5-325 MG PO TABS: 1.0000 | ORAL_TABLET | Freq: Four times a day (QID) | ORAL | 0 refills | Status: DC | PRN

## 2020-11-23 MED ORDER — CLINDAMYCIN HCL 150 MG PO CAPS: ORAL_CAPSULE | ORAL | 0 refills | Status: DC

## 2020-11-23 NOTE — Discharge Instructions (Addendum)
Follow-up with your primary care provider if any continued problems.  Return to the emergency department over the weekend if any severe worsening of your symptoms or not improving, such as enlargement, fever, chills, nausea or vomiting.  A prescription for 2 antibiotics was sent to your pharmacy.  Take all until completely finished.  A prescription also for pain medication was sent if needed to be taken every 6 hours if needed for pain.  This medication could cause drowsiness and increased risk for injury.  Do not drive or operate machinery while taking this.  You may use warm moist compresses to your face.  Do not use a dry heating pad as this may cause burns to your skin.

## 2020-11-23 NOTE — ED Provider Notes (Signed)
[facility] Emergency Department Provider Note  ____________________________________________   Event Date/Time   First MD Initiated Contact with Patient 11/23/20 [phone]     (approximate)  I have reviewed the triage vital signs and the nursing notes.   HISTORY  Chief Complaint No chief complaint on file.   HPI Clearnce Aaron Braun is a 50 y.o. male presents with left eye swelling x 4 days.  Patient states that on Friday he saw what he thought was an infected hair in his eyebrow.  He pulled this and was able to squeeze pus out of it.  Since that time he has continued to have increased swelling in the area and today his left eyelid is also swollen.  Patient denies any fever or chills.  When he is able to get his eyelid up he has no change in vision.  He denies any discharge from his itself.  Patient has type II diabetic.  He reports that his blood sugar is out of control secondary to taking prednisone for COVID-related pneumonia.  His last dose was yesterday.  He rates his pain as an 8 out of 10.       Past Medical History:  Diagnosis Date  . Arthritis   . Bipolar 1 disorder ([place])   . Diabetes mellitus without complication ([place])   . Hyperlipidemia   . Hypertension   . Sleep apnea     Patient Active Problem List   Diagnosis Date Noted  . Bipolar depression ([place]) 05/12/2020  . Bipolar 1 disorder ([place]) 05/11/2020  . Anxiety 05/11/2020  . Borderline personality disorder ([place]) 05/11/2020  . Uncontrolled type 2 diabetes mellitus with hyperglycemia ([place]) 08/26/2019  . Mixed hyperlipidemia 08/26/2019  . Essential hypertension, benign 08/26/2019    Past Surgical History:  Procedure Laterality Date  . WISDOM TOOTH EXTRACTION      Prior to Admission medications   Medication Sig Start Date End Date Taking? Authorizing Provider  amoxicillin-clavulanate (AUGMENTIN) 875-125 MG tablet Take 1 tablet by mouth 2 (two) times daily for 7 days. 11/23/20 11/30/20 Yes  Johnn Hai, PA-C  clindamycin (CLEOCIN) 150 MG capsule 2 tabs tid until finished 11/23/20  Yes Summers, Wendi Maya, PA-C  HYDROcodone-acetaminophen (NORCO/VICODIN) 5-325 MG tablet Take 1 tablet by mouth every 6 (six) hours as needed for moderate pain. 11/23/20  Yes Letitia Neri L, PA-C  atorvastatin (LIPITOR) 20 MG tablet Take 20 mg by mouth daily. 01/15/20   [provider]  Blood Glucose Monitoring Suppl (ACCU-CHEK GUIDE) w/Device KIT 1 each by Does not apply route 4 (four) times daily. 10/08/19   Cassandria Anger, MD  divalproex (DEPAKOTE) 500 MG DR tablet Take 1 tablet (500 mg total) by mouth every 12 (twelve) hours. Patient taking differently: Take 1,000 mg by mouth daily. 500 mg in pm 05/15/20   Clapacs, John T, MD  glipiZIDE (GLUCOTROL XL) 5 MG 24 hr tablet Take 5 mg by mouth daily. 08/06/20 08/06/21  [provider]  glucose blood (ACCU-CHEK GUIDE) test strip Use as instructed 4 x daily. E11.65 10/08/19   Cassandria Anger, MD  Insulin Pen Needle (PEN NEEDLES) 31G X 8 MM MISC 1 each by Does not apply route at bedtime. 08/26/19   Cassandria Anger, MD  LANTUS SOLOSTAR 100 UNIT/ML Solostar Pen Inject 30 Units into the skin at bedtime. 03/04/20   [provider]  loratadine (CLARITIN) 10 MG tablet Take 10 mg by mouth daily. 03/07/20   [provider]  losartan (COZAAR) 50  MG tablet Take 50 mg by mouth daily. 03/22/20   [provider]  mometasone (NASONEX) 50 MCG/ACT nasal spray Place 2 sprays into the nose daily as needed (allergies).  03/02/20   [provider]  Multiple Vitamin (MULTIVITAMIN) tablet Take 1 tablet by mouth daily.    [provider]  NOVOLOG FLEXPEN 100 UNIT/ML FlexPen Inject 5 Units into the skin 3 (three) times daily. Plus sliding scale 03/29/20   [provider]  omeprazole (PRILOSEC) 20 MG capsule Take 20 mg by mouth daily. 05/01/20   [provider]  traZODone (DESYREL) 50 MG tablet  Take 50 mg by mouth at bedtime. 09/15/20 09/15/21  [provider]  VICTOZA 18 MG/3ML SOPN Inject 1.6 mg into the skin daily.  03/04/20   [provider]    Allergies Other  Family History  Problem Relation Age of Onset  . Diabetes Mother   . Thyroid disease Mother   . Hypertension Mother   . Hyperlipidemia Mother   . CAD Mother   . Kidney disease Mother   . Stroke Mother   . Heart attack Mother   . Diabetes Father   . Hypertension Father   . Hyperlipidemia Father   . Heart attack Father   . CAD Father     Social History Social History   Tobacco Use  . Smoking status: Current Every Day Smoker    Packs/day: 1.00    Years: 35.00    Pack years: 35.00    Types: Cigarettes  . Smokeless tobacco: Never Used  Vaping Use  . Vaping Use: Never used  Substance Use Topics  . Alcohol use: Never  . Drug use: Never    Review of Systems Constitutional: No fever/chills Eyes: Left eyelid swollen. ENT: No sore throat. Cardiovascular: Denies chest pain. Respiratory: Denies shortness of breath. Gastrointestinal:  No nausea, no vomiting.  Musculoskeletal: Negative for skeletal pain. Skin: Negative for rash. Neurological: Negative for headaches, focal weakness or numbness. Endocrine:  Type 2 diabetes. Allergic/Immunilogical: None  ____________________________________________   PHYSICAL EXAM:  VITAL SIGNS: ED Triage Vitals  Enc Vitals Group     BP 11/23/20 0918 136/90     Pulse Rate 11/23/20 0918 (!) 104     Resp 11/23/20 0918 18     Temp 11/23/20 0918 98.2 F (36.8 C)     Temp Source 11/23/20 0918 Oral     SpO2 11/23/20 0918 98 %     Weight 11/23/20 0913 (!) 352 lb 11.8 oz (160 kg)     Height 11/23/20 0913 6' (1.829 m)     Head Circumference --      Peak Flow --      Pain Score 11/23/20 0913 8     Pain Loc --      Pain Edu? --      Excl. in [place]? --     Constitutional: Alert and oriented. Well appearing and in no acute distress. Eyes:  Conjunctivae are normal bilaterally.  No drainage from the area.  On examination of the left eyebrow there is a pinpoint area without active drainage at this time.  Generally the entire area including the eyelid is tender to palpation and erythematous.  PERRL. EOMI. Head: Atraumatic. Nose: No congestion/rhinnorhea. Neck: No stridor.   Hematological/Lymphatic/Immunilogical: No cervical lymphadenopathy. Cardiovascular: Normal rate, regular rhythm. Grossly normal heart sounds.  Good peripheral circulation. Respiratory: Normal respiratory effort.  No retractions. Lungs CTAB. Musculoskeletal: Moves upper and lower extremities and difficulty.  Normal gait was  noted. Neurologic:  Normal speech and language. No gross focal neurologic deficits are appreciated. No gait instability. Skin:  Skin is warm, dry erythematous left eyelid as noted above. Psychiatric: Mood and affect are normal. Speech and behavior are normal.  ____________________________________________   LABS (all labs ordered are listed, but only abnormal results are displayed)  Labs Reviewed  CBC WITH DIFFERENTIAL/PLATELET - Abnormal; Notable for the following components:      Result Value   WBC 11.8 (*)    Neutro Abs 7.9 (*)    Abs Immature Granulocytes 0.09 (*)    All other components within normal limits  COMPREHENSIVE METABOLIC PANEL - Abnormal; Notable for the following components:   Glucose, Bld 317 (*)    BUN 24 (*)    Creatinine, Ser 0.55 (*)    Calcium 8.0 (*)    Total Protein 6.3 (*)    Albumin 3.4 (*)    All other components within normal limits    RADIOLOGY I, Johnn Hai, personally viewed and evaluated these images (plain radiographs) as part of my medical decision making, as well as reviewing the written report by the radiologist.   Official radiology report(s): CT Orbits W Contrast  Result Date: 11/23/2020 CLINICAL DATA:  Periorbital cellulitis. EXAM: CT ORBITS WITH CONTRAST TECHNIQUE: Multidetector CT  images was performed according to the standard protocol following intravenous contrast administration. CONTRAST:  58mL OMNIPAQUE IOHEXOL 300 MG/ML  SOLN COMPARISON:  None. FINDINGS: Orbits: There is left forehead, periorbital, and premaxillary soft tissue stranding and fluid, compatible with cellulitis/phlegmon. Possible slight postseptal fat stranding in the the superolateral extraconal orbit. No discrete, drainable fluid collection. The globes are symmetric and within normal limits. Extraocular muscles are normal. No proptosis. Visualized sinuses: Mild anterior ethmoid air cell and inferior frontal sinus mucosal thickening without air-fluid levels. Soft tissues: There is left forehead, periorbital, premaxillary soft tissue stranding and fluid, compatible with cellulitis/phlegmon. Limited intracranial: No significant or unexpected finding. IMPRESSION: Findings consistent with left forehead/periorbital/premaxillary cellulitis/phlegmon without discrete drainable fluid collection. Possible slight postseptal extension in the superolateral extraconal orbit. Electronically Signed   By: Margaretha Sheffield MD   On: 11/23/2020 12:21    ____________________________________________   PROCEDURES  Procedure(s) performed (including Critical Care):  Procedures   ____________________________________________   INITIAL IMPRESSION / ASSESSMENT AND PLAN / ED COURSE  As part of my medical decision making, I reviewed the following data within the electronic MEDICAL RECORD NUMBER Notes from prior ED visits and [place] Controlled Substance Database  50 year old male with history of non-insulin-dependent diabetes is seen in the emergency room today with complaint of erythematous area above his left eyebrow.  Patient states that he has been squeezing it trying to open it up.  This morning he woke with increased swelling to his left eyelid.  He initially thought that he had an infected eye brow hair.  He denies any fever or chills.   Patient in the ED is afebrile.  His white count was mildly elevated at 11.8 and glucose was elevated at 317.  States this is higher than his normal as he is just recently discontinued taking steroids for his COVID-pneumonia.  Patient states that his normal is usually in the 150s.  CT scan shows left periorbital cellulitis but no localized fluid or collection that can be drained.  Patient was given clindamycin while in the emergency department.  We discussed using warm moist compresses to the area frequently and not to pick at this area any longer.  Patient was discharged  with a prescription for Norco if needed for pain.  2 prescriptions 1 clindamycin and the other being Augmentin was sent to his pharmacy.  Patient is where he will be taken to antibiotics.  If any worsening over the course of the week or especially the weekend he is to return to the emergency department.  ____________________________________________   FINAL CLINICAL IMPRESSION(S) / ED DIAGNOSES  Final diagnoses:  Periorbital cellulitis of left eye     ED Discharge Orders         Ordered    HYDROcodone-acetaminophen (NORCO/VICODIN) 5-325 MG tablet  Every 6 hours PRN        11/23/20 1324    clindamycin (CLEOCIN) 150 MG capsule        11/23/20 1324    amoxicillin-clavulanate (AUGMENTIN) 875-125 MG tablet  2 times daily        11/23/20 1324          *Please note:  Tristain Daily was evaluated in Emergency Department on 11/23/2020 for the symptoms described in the history of present illness. He was evaluated in the context of the global COVID-19 pandemic, which necessitated consideration that the patient might be at risk for infection with the SARS-CoV-2 virus that causes COVID-19. Institutional protocols and algorithms that pertain to the evaluation of patients at risk for COVID-19 are in a state of rapid change based on information released by regulatory bodies including the CDC and federal and state organizations. These  policies and algorithms were followed during the patient's care in the ED.  Some ED evaluations and interventions may be delayed as a result of limited staffing during and the pandemic.*   Note:  This document was prepared using Dragon voice recognition software and may include unintentional dictation errors.   Johnn Hai, PA-C 11/23/20 1405    Merlyn Lot, MD 11/23/20 1525

## 2020-11-23 NOTE — ED Triage Notes (Signed)
Presents with pain and swelling to left eye  Sx's started on friday

## 2020-11-25 DIAGNOSIS — L03211 Cellulitis of face: Secondary | ICD-10-CM | POA: Diagnosis not present

## 2020-11-25 DIAGNOSIS — E119 Type 2 diabetes mellitus without complications: Secondary | ICD-10-CM | POA: Diagnosis not present

## 2020-11-25 DIAGNOSIS — L03213 Periorbital cellulitis: Secondary | ICD-10-CM | POA: Diagnosis not present

## 2020-11-25 DIAGNOSIS — L0201 Cutaneous abscess of face: Secondary | ICD-10-CM | POA: Diagnosis not present

## 2020-12-02 DIAGNOSIS — F4312 Post-traumatic stress disorder, chronic: Secondary | ICD-10-CM | POA: Diagnosis not present

## 2020-12-02 DIAGNOSIS — L03213 Periorbital cellulitis: Secondary | ICD-10-CM | POA: Diagnosis not present

## 2020-12-08 DIAGNOSIS — G4733 Obstructive sleep apnea (adult) (pediatric): Secondary | ICD-10-CM | POA: Diagnosis not present

## 2020-12-09 DIAGNOSIS — F4312 Post-traumatic stress disorder, chronic: Secondary | ICD-10-CM | POA: Diagnosis not present

## 2020-12-16 DIAGNOSIS — F4312 Post-traumatic stress disorder, chronic: Secondary | ICD-10-CM | POA: Diagnosis not present

## 2020-12-23 DIAGNOSIS — F4312 Post-traumatic stress disorder, chronic: Secondary | ICD-10-CM | POA: Diagnosis not present

## 2020-12-24 DIAGNOSIS — E1165 Type 2 diabetes mellitus with hyperglycemia: Secondary | ICD-10-CM | POA: Diagnosis not present

## 2021-01-05 DIAGNOSIS — G4733 Obstructive sleep apnea (adult) (pediatric): Secondary | ICD-10-CM | POA: Diagnosis not present

## 2021-01-06 DIAGNOSIS — F4312 Post-traumatic stress disorder, chronic: Secondary | ICD-10-CM | POA: Diagnosis not present

## 2021-01-13 DIAGNOSIS — E785 Hyperlipidemia, unspecified: Secondary | ICD-10-CM | POA: Diagnosis not present

## 2021-01-13 DIAGNOSIS — I10 Essential (primary) hypertension: Secondary | ICD-10-CM | POA: Diagnosis not present

## 2021-01-13 DIAGNOSIS — F4312 Post-traumatic stress disorder, chronic: Secondary | ICD-10-CM | POA: Diagnosis not present

## 2021-01-13 DIAGNOSIS — F319 Bipolar disorder, unspecified: Secondary | ICD-10-CM | POA: Diagnosis not present

## 2021-01-13 DIAGNOSIS — Z6841 Body Mass Index (BMI) 40.0 and over, adult: Secondary | ICD-10-CM | POA: Diagnosis not present

## 2021-01-13 DIAGNOSIS — F17210 Nicotine dependence, cigarettes, uncomplicated: Secondary | ICD-10-CM | POA: Diagnosis not present

## 2021-01-13 DIAGNOSIS — E1165 Type 2 diabetes mellitus with hyperglycemia: Secondary | ICD-10-CM | POA: Diagnosis not present

## 2021-01-21 DIAGNOSIS — E1165 Type 2 diabetes mellitus with hyperglycemia: Secondary | ICD-10-CM | POA: Diagnosis not present

## 2021-01-27 DIAGNOSIS — F4312 Post-traumatic stress disorder, chronic: Secondary | ICD-10-CM | POA: Diagnosis not present

## 2021-01-27 DIAGNOSIS — Z6841 Body Mass Index (BMI) 40.0 and over, adult: Secondary | ICD-10-CM | POA: Diagnosis not present

## 2021-01-27 DIAGNOSIS — E1165 Type 2 diabetes mellitus with hyperglycemia: Secondary | ICD-10-CM | POA: Diagnosis not present

## 2021-01-27 DIAGNOSIS — E782 Mixed hyperlipidemia: Secondary | ICD-10-CM | POA: Diagnosis not present

## 2021-01-27 DIAGNOSIS — I10 Essential (primary) hypertension: Secondary | ICD-10-CM | POA: Diagnosis not present

## 2021-01-28 DIAGNOSIS — E113293 Type 2 diabetes mellitus with mild nonproliferative diabetic retinopathy without macular edema, bilateral: Secondary | ICD-10-CM | POA: Diagnosis not present

## 2021-01-31 DIAGNOSIS — I10 Essential (primary) hypertension: Secondary | ICD-10-CM | POA: Diagnosis not present

## 2021-01-31 DIAGNOSIS — L03315 Cellulitis of perineum: Secondary | ICD-10-CM | POA: Diagnosis not present

## 2021-01-31 DIAGNOSIS — E1165 Type 2 diabetes mellitus with hyperglycemia: Secondary | ICD-10-CM | POA: Diagnosis not present

## 2021-02-03 DIAGNOSIS — F4312 Post-traumatic stress disorder, chronic: Secondary | ICD-10-CM | POA: Diagnosis not present

## 2021-02-03 DIAGNOSIS — L0390 Cellulitis, unspecified: Secondary | ICD-10-CM | POA: Diagnosis not present

## 2021-02-03 DIAGNOSIS — F251 Schizoaffective disorder, depressive type: Secondary | ICD-10-CM | POA: Diagnosis not present

## 2021-02-03 DIAGNOSIS — F4310 Post-traumatic stress disorder, unspecified: Secondary | ICD-10-CM | POA: Diagnosis not present

## 2021-02-03 DIAGNOSIS — F900 Attention-deficit hyperactivity disorder, predominantly inattentive type: Secondary | ICD-10-CM | POA: Diagnosis not present

## 2021-02-03 DIAGNOSIS — E119 Type 2 diabetes mellitus without complications: Secondary | ICD-10-CM | POA: Diagnosis not present

## 2021-02-03 DIAGNOSIS — L0291 Cutaneous abscess, unspecified: Secondary | ICD-10-CM | POA: Diagnosis not present

## 2021-02-03 DIAGNOSIS — F411 Generalized anxiety disorder: Secondary | ICD-10-CM | POA: Diagnosis not present

## 2021-02-05 DIAGNOSIS — G4733 Obstructive sleep apnea (adult) (pediatric): Secondary | ICD-10-CM | POA: Diagnosis not present

## 2021-02-10 DIAGNOSIS — F4312 Post-traumatic stress disorder, chronic: Secondary | ICD-10-CM | POA: Diagnosis not present

## 2021-02-15 DIAGNOSIS — F17200 Nicotine dependence, unspecified, uncomplicated: Secondary | ICD-10-CM | POA: Diagnosis not present

## 2021-02-15 DIAGNOSIS — E782 Mixed hyperlipidemia: Secondary | ICD-10-CM | POA: Diagnosis not present

## 2021-02-15 DIAGNOSIS — I10 Essential (primary) hypertension: Secondary | ICD-10-CM | POA: Diagnosis not present

## 2021-02-15 DIAGNOSIS — Z794 Long term (current) use of insulin: Secondary | ICD-10-CM | POA: Diagnosis not present

## 2021-02-15 DIAGNOSIS — E1165 Type 2 diabetes mellitus with hyperglycemia: Secondary | ICD-10-CM | POA: Diagnosis not present

## 2021-02-17 DIAGNOSIS — F4312 Post-traumatic stress disorder, chronic: Secondary | ICD-10-CM | POA: Diagnosis not present

## 2021-02-21 DIAGNOSIS — E1165 Type 2 diabetes mellitus with hyperglycemia: Secondary | ICD-10-CM | POA: Diagnosis not present

## 2021-02-22 ENCOUNTER — Other Ambulatory Visit: Payer: Self-pay

## 2021-02-22 ENCOUNTER — Ambulatory Visit
Admission: RE | Admit: 2021-02-22 | Discharge: 2021-02-22 | Disposition: A | Discharge status: Home / Self Care | Payer: Medicare HMO | Source: Ambulatory Visit | Attending: Family Medicine | Admitting: Family Medicine

## 2021-02-22 ENCOUNTER — Other Ambulatory Visit: Payer: Self-pay | Admitting: Family Medicine

## 2021-02-22 DIAGNOSIS — R142 Eructation: Secondary | ICD-10-CM | POA: Diagnosis not present

## 2021-02-22 DIAGNOSIS — R1013 Epigastric pain: Secondary | ICD-10-CM | POA: Insufficient documentation

## 2021-02-22 DIAGNOSIS — R140 Abdominal distension (gaseous): Secondary | ICD-10-CM | POA: Diagnosis not present

## 2021-02-22 DIAGNOSIS — R109 Unspecified abdominal pain: Secondary | ICD-10-CM | POA: Diagnosis not present

## 2021-02-22 IMAGING — CT CT ABD-PELV W/ CM
2 of 5 series · 16 of 46 positions shown, 18 images · IV contrast (APPLIED)
Comparison: [DATE]

CLINICAL DATA: Abdominal pain and bloating May.

EXAM:
CT ABDOMEN AND PELVIS WITH CONTRAST
TECHNIQUE: Multidetector CT imaging of the abdomen and pelvis was performed
using the standard protocol following bolus administration of
intravenous contrast.
CONTRAST:  125mL OMNIPAQUE IOHEXOL 300 MG/ML  SOLN

[Series 2: routine abd/pel with · axial · 0.98mm/px · z∈[-584,-59]mm · 13 of 119 slices shown, 15 images]
[im 7/119  soft-tissue]
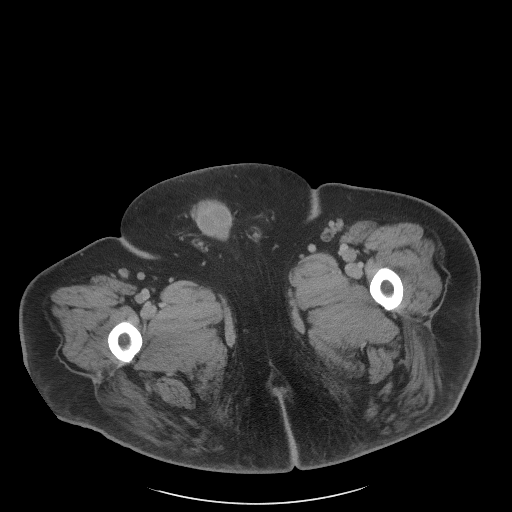
[im 7/119  bone]
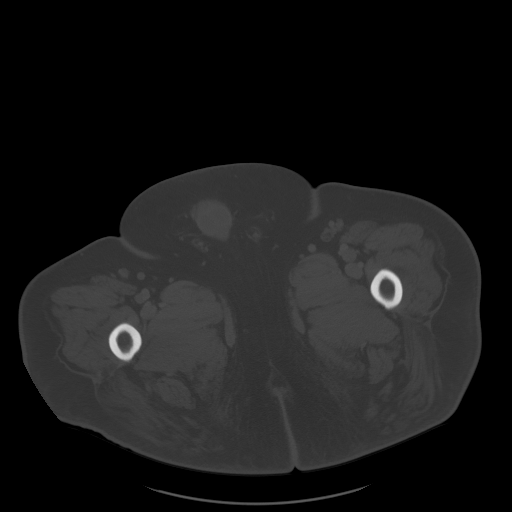
[im 14/119  soft-tissue]
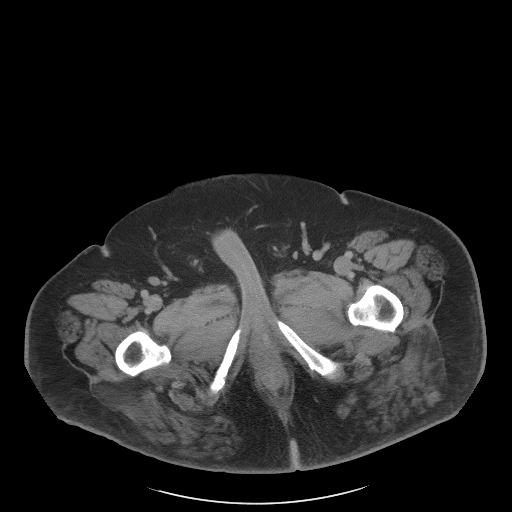
[im 28/119  soft-tissue]
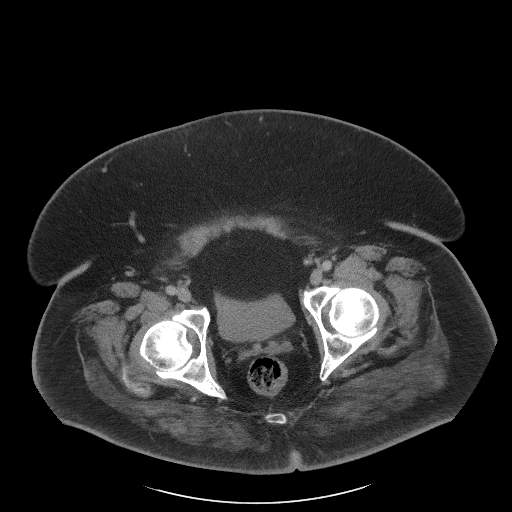
[im 35/119  soft-tissue]
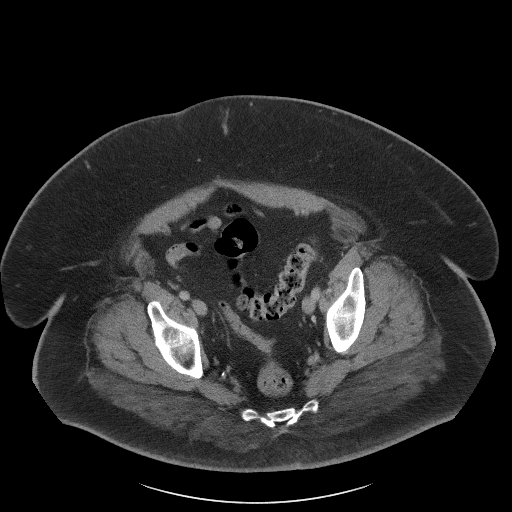
[im 42/119  soft-tissue]
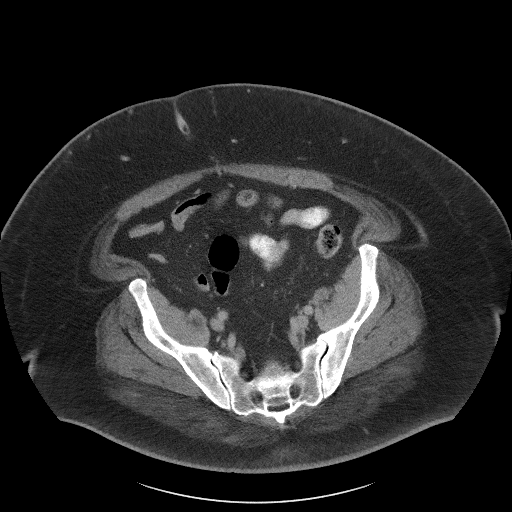
[im 49/119  soft-tissue]
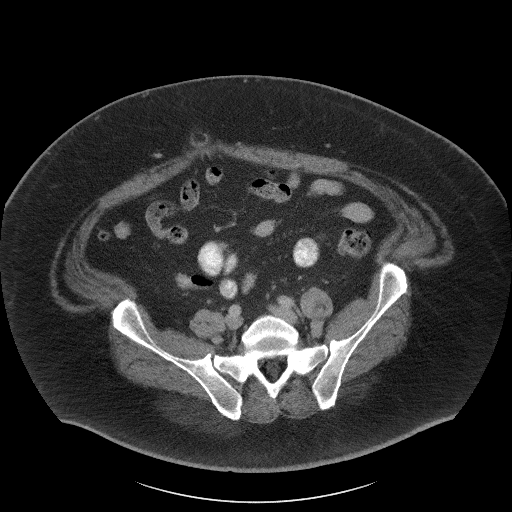
[im 63/119  soft-tissue]
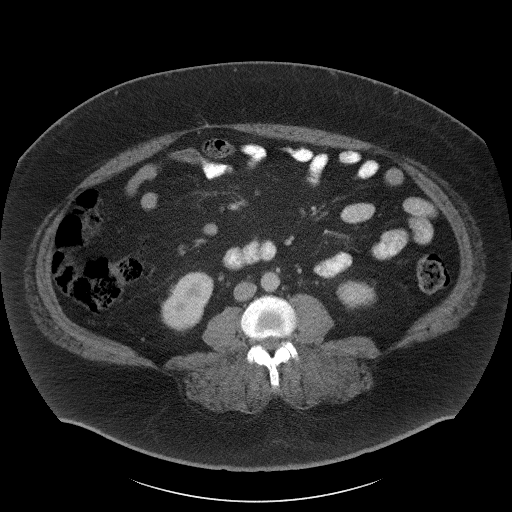
[im 70/119  soft-tissue]
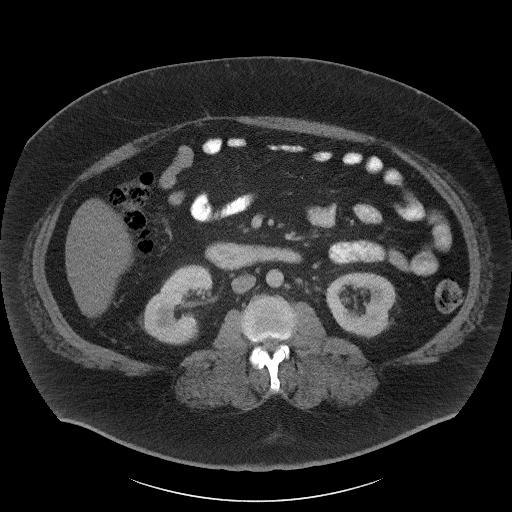
[im 77/119  soft-tissue]
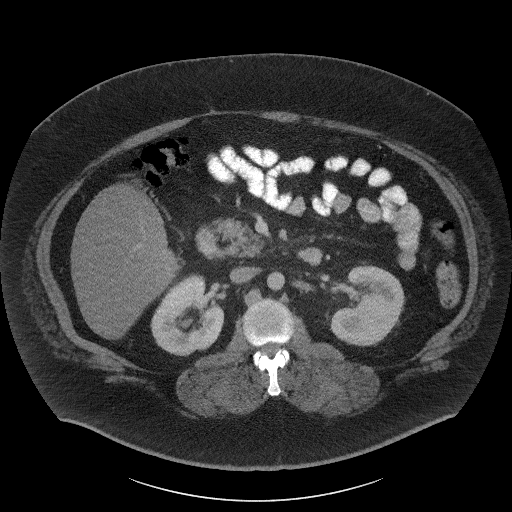
[im 77/119  bone]
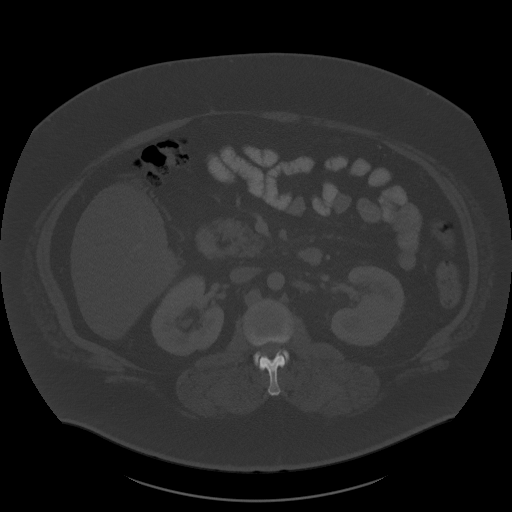
[im 84/119  soft-tissue]
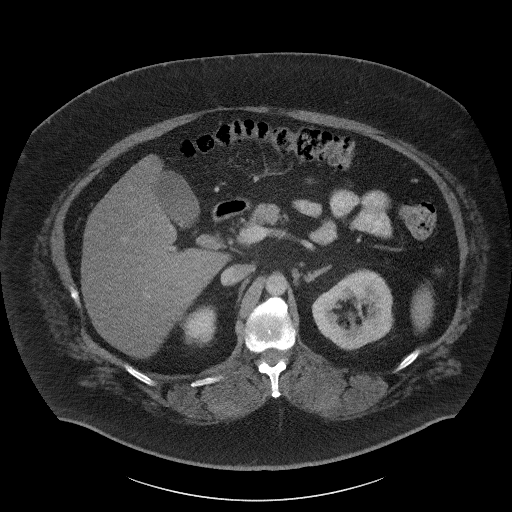
[im 91/119  soft-tissue]
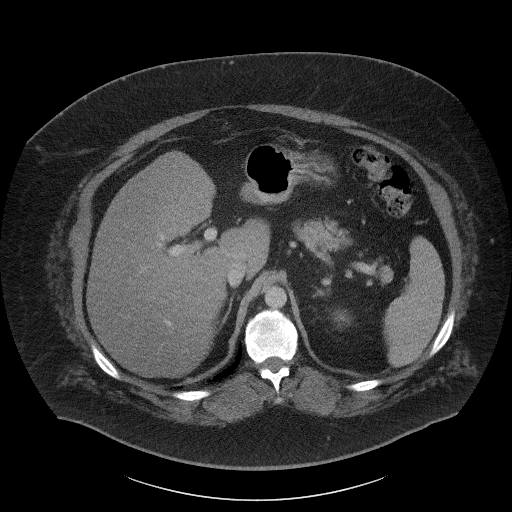
[im 105/119  soft-tissue]
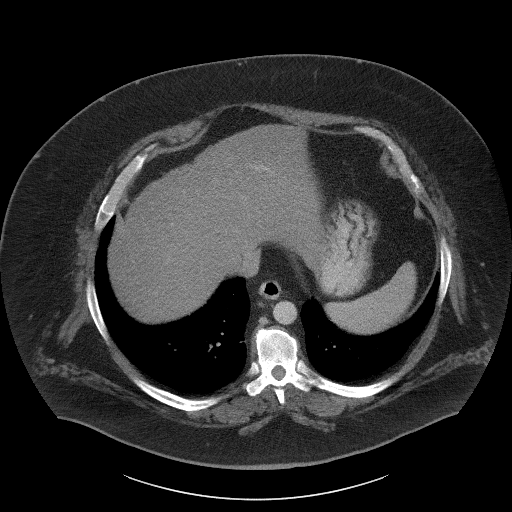
[im 112/119  soft-tissue]
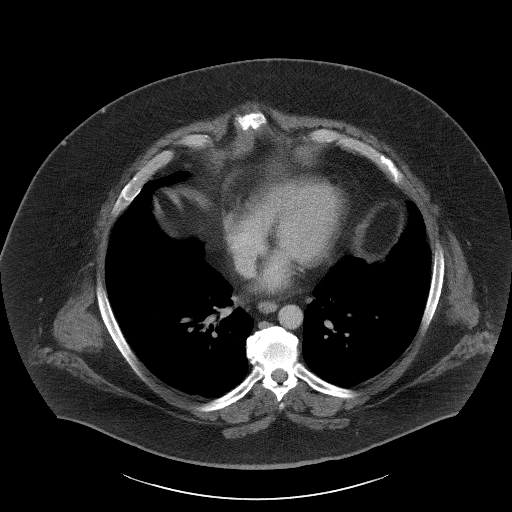

[Series 5: coronal st · coronal · 1.04mm/px · 3 of 134 slices shown]
[im 45/134  soft-tissue]
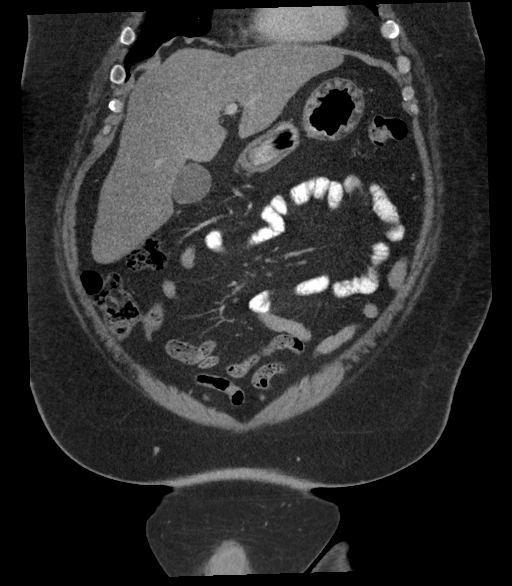
[im 60/134  soft-tissue]
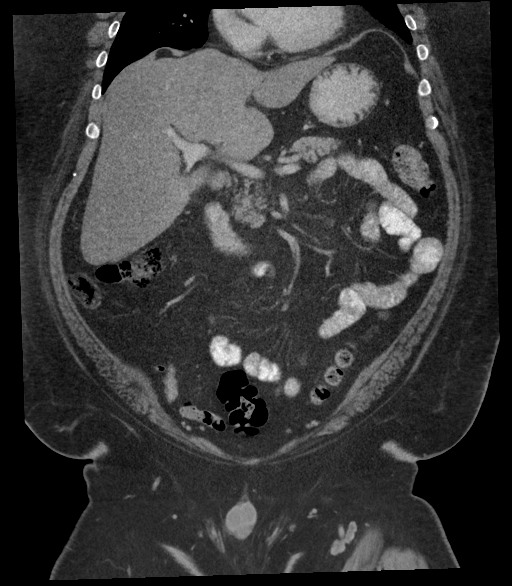
[im 74/134  soft-tissue]
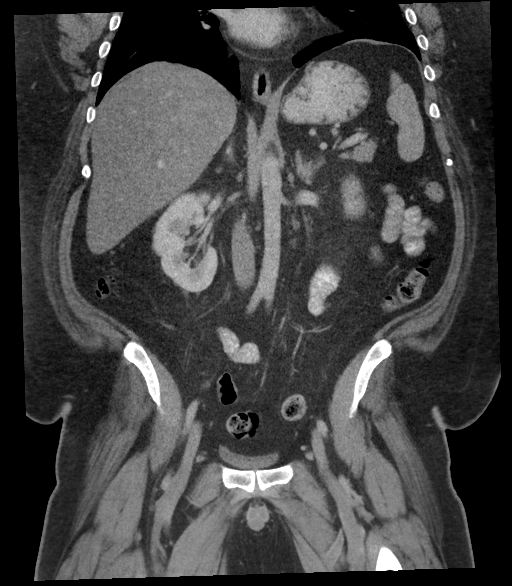

[16 of 46 positions shown; findings below may reference images not displayed]

FINDINGS: Lower chest: The lung bases are clear of acute process. No pleural
effusion or pulmonary lesions. The heart is normal in size. No
pericardial effusion. The distal esophagus and aorta are
unremarkable.

Hepatobiliary: No hepatic lesions or intrahepatic biliary
dilatation. The gallbladder contains numerous small layering
gallstones but no findings for acute cholecystitis. No common bile
duct dilatation.

Pancreas: No mass, inflammation or ductal dilatation.

Spleen: Normal size.  No focal lesion.

Adrenals/Urinary Tract: Adrenal glands and kidneys are unremarkable.
The bladder is unremarkable.

Stomach/Bowel: The stomach, duodenum, small bowel and colon are
grossly normal. No acute inflammatory process, mass lesions or
obstructive findings. The terminal ileum is normal. The appendix is
normal.

Vascular/Lymphatic: The aorta is normal in caliber. No dissection.
The branch vessels are patent. The major venous structures are
patent. No mesenteric or retroperitoneal mass or adenopathy. Small
scattered lymph nodes are noted.

Reproductive: The prostate gland and seminal vesicles are
unremarkable.

Other: No pelvic mass or adenopathy. No free pelvic fluid
collections. No inguinal mass or adenopathy. No abdominal wall
hernia or subcutaneous lesions.

Musculoskeletal: No significant bony findings.
IMPRESSION: 1. No acute abdominal/pelvic findings, mass lesions or adenopathy.
2. Cholelithiasis without CT findings for acute cholecystitis.

## 2021-02-22 MED ORDER — IOHEXOL 300 MG/ML  SOLN
125.0000 mL | Freq: Once | INTRAMUSCULAR | Status: AC | PRN
  Administered 2021-02-22: 125 mL via INTRAVENOUS

## 2021-02-24 ENCOUNTER — Ambulatory Visit: Payer: Self-pay | Admitting: General Surgery

## 2021-02-24 DIAGNOSIS — Z6841 Body Mass Index (BMI) 40.0 and over, adult: Secondary | ICD-10-CM | POA: Diagnosis not present

## 2021-02-24 DIAGNOSIS — E1165 Type 2 diabetes mellitus with hyperglycemia: Secondary | ICD-10-CM | POA: Diagnosis not present

## 2021-02-24 DIAGNOSIS — K8020 Calculus of gallbladder without cholecystitis without obstruction: Secondary | ICD-10-CM | POA: Diagnosis not present

## 2021-02-24 DIAGNOSIS — F17200 Nicotine dependence, unspecified, uncomplicated: Secondary | ICD-10-CM | POA: Diagnosis not present

## 2021-03-07 DIAGNOSIS — G4733 Obstructive sleep apnea (adult) (pediatric): Secondary | ICD-10-CM | POA: Diagnosis not present

## 2021-03-08 ENCOUNTER — Other Ambulatory Visit
Admission: RE | Admit: 2021-03-08 | Discharge: 2021-03-08 | Disposition: A | Discharge status: Home / Self Care | Payer: Medicare HMO | Source: Ambulatory Visit | Attending: General Surgery | Admitting: General Surgery

## 2021-03-08 ENCOUNTER — Other Ambulatory Visit: Payer: Self-pay

## 2021-03-08 HISTORY — DX: Borderline personality disorder: F60.3

## 2021-03-08 HISTORY — DX: Other psychoactive substance abuse, in remission: F19.11

## 2021-03-08 HISTORY — DX: Type 2 diabetes mellitus with hyperglycemia: E11.65

## 2021-03-08 HISTORY — DX: Gastro-esophageal reflux disease without esophagitis: K21.9

## 2021-03-08 HISTORY — DX: Pneumonia, unspecified organism: J18.9

## 2021-03-08 NOTE — Patient Instructions (Addendum)
Your procedure is scheduled on:  Monday, May 9 Report to the Registration Desk on the 1st floor of the [facility] To find out your arrival time, please call [phone] between 1PM - 3PM on: Friday, May 6  REMEMBER: Instructions that are not followed completely may result in serious medical risk, up to and including death; or upon the discretion of your surgeon and anesthesiologist your surgery may need to be rescheduled.  Do not eat food after midnight the night before surgery.  No gum chewing, lozengers or hard candies.  You may however, drink water up to 2 hours before you are scheduled to arrive for your surgery. Do not drink anything within 2 hours of your scheduled arrival time.  TAKE THESE MEDICATIONS THE MORNING OF SURGERY WITH A SIP OF WATER:  1.  Atorvastatin (Lipitor) 2.  Divalproex (Depakote) 3.  Omeprazole (Prilosec) - (take one the night before and one on the morning of surgery - helps to prevent nausea after surgery.)  After medical review from the Pre-admission Testing Nurse Practitioner and due to elevated blood sugars in the past; take your usual dosage of Lantus the night before surgery.  One week prior to surgery: starting today, May 3 Stop aspirin and Anti-inflammatories (NSAIDS) such as Advil, Aleve, Ibuprofen, Motrin, Naproxen, Naprosyn and Aspirin based products such as Excedrin, Goodys Powder, BC Powder. Stop ANY OVER THE COUNTER supplements until after surgery.  No Alcohol for 24 hours before or after surgery.  No Smoking including e-cigarettes for 24 hours prior to surgery.  No chewable tobacco products for at least 6 hours prior to surgery.  No nicotine patches on the day of surgery.  Do not use any "recreational" drugs for at least a week prior to your surgery.  Please be advised that the combination of cocaine and anesthesia may have negative outcomes, up to and including death. If you test positive for cocaine, your surgery will be cancelled.  On  the morning of surgery brush your teeth with toothpaste and water, you may rinse your mouth with mouthwash if you wish. Do not swallow any toothpaste or mouthwash.  Do not wear jewelry, make-up, hairpins, clips or nail polish.  Do not wear lotions, powders, or perfumes.   Do not shave body from the neck down 48 hours prior to surgery just in case you cut yourself which could leave a site for infection.  Also, freshly shaved skin may become irritated if using the CHG soap.  Contact lenses, hearing aids and dentures may not be worn into surgery.  Do not bring valuables to the hospital. [facility] is not responsible for any missing/lost belongings or valuables.   Use CHG Soap as directed on instruction sheet.  Bring your C-PAP to the hospital with you in case you may have to spend the night.   Notify your doctor if there is any change in your medical condition (cold, fever, infection).  Wear comfortable clothing (specific to your surgery type) to the hospital.  Plan for stool softeners for home use; pain medications have a tendency to cause constipation. You can also help prevent constipation by eating foods high in fiber such as fruits and vegetables and drinking plenty of fluids as your diet allows.  After surgery, you can help prevent lung complications by doing breathing exercises.  Take deep breaths and cough every 1-2 hours. Your doctor may order a device called an Incentive Spirometer to help you take deep breaths. When coughing or sneezing, hold a pillow  firmly against your incision with both hands. This is called "splinting." Doing this helps protect your incision. It also decreases belly discomfort.  If you are being discharged the day of surgery, you will not be allowed to drive home. You will need a responsible adult (18 years or older) to drive you home and stay with you that night.   If you are taking public transportation, you will need to have a responsible adult (18  years or older) with you. Please confirm with your physician that it is acceptable to use public transportation.   Please call the Pre-admissions Testing Dept. at [phone] if you have any questions about these instructions.  Surgery Visitation Policy:  Patients undergoing a surgery or procedure may have one family member or support person with them as long as that person is not COVID-19 positive or experiencing its symptoms.  That person may remain in the waiting area during the procedure.

## 2021-03-08 NOTE — Progress Notes (Signed)
  Perioperative Services: Pre-Admission/Anesthesia Testing  Abnormal Lab Notification    Date: 03/08/21  Name: Aaron Braun MRN:   [id number]  Re: Abnormal labs noted during PAT appointment   Provider(s) Notified: Carolan Shiver, MD Notification mode: Routed and/or faxed via [facility]   ABNORMAL LAB VALUE(S): Lab Results  Component Value Date   GLUCOSE 386 (H) 02/22/2021    Notes: Patient with a T2DM diagnosis. He is currently on both oral (glipizide) and parenteral (Lantus, Novolog, Victoza) therapies. Last Hgb A1c was 10.8% on 01/27/2021. This communication is being sent in order to determine if patient is deemed to have adequate preoperative glycemic control in efforts to reduce his risk of perioperative complications and postoperative SSI.  The benefit of improving glycemic control must be weighed against the overall risk associated with delaying a necessary elective surgery for this patient.   This is a Personal assistant; no formal response is required.  Quentin Mulling, MSN, APRN, FNP-C, CEN [facility]  Peri-operative Services Nurse Practitioner Phone: [phone] 03/08/21 4:02 PM

## 2021-03-09 ENCOUNTER — Other Ambulatory Visit
Admission: RE | Admit: 2021-03-09 | Discharge: 2021-03-09 | Disposition: A | Discharge status: Home / Self Care | Payer: Medicare HMO | Source: Ambulatory Visit | Attending: General Surgery | Admitting: General Surgery

## 2021-03-09 DIAGNOSIS — E119 Type 2 diabetes mellitus without complications: Secondary | ICD-10-CM | POA: Insufficient documentation

## 2021-03-09 DIAGNOSIS — Z01810 Encounter for preprocedural cardiovascular examination: Secondary | ICD-10-CM | POA: Insufficient documentation

## 2021-03-09 DIAGNOSIS — I10 Essential (primary) hypertension: Secondary | ICD-10-CM | POA: Diagnosis not present

## 2021-03-09 DIAGNOSIS — Z01818 Encounter for other preprocedural examination: Secondary | ICD-10-CM | POA: Diagnosis not present

## 2021-03-10 ENCOUNTER — Other Ambulatory Visit: Payer: Medicare HMO

## 2021-03-10 DIAGNOSIS — F4312 Post-traumatic stress disorder, chronic: Secondary | ICD-10-CM | POA: Diagnosis not present

## 2021-03-12 DIAGNOSIS — E860 Dehydration: Secondary | ICD-10-CM | POA: Diagnosis not present

## 2021-03-12 DIAGNOSIS — I10 Essential (primary) hypertension: Secondary | ICD-10-CM | POA: Diagnosis not present

## 2021-03-12 DIAGNOSIS — E785 Hyperlipidemia, unspecified: Secondary | ICD-10-CM | POA: Diagnosis not present

## 2021-03-12 DIAGNOSIS — R112 Nausea with vomiting, unspecified: Secondary | ICD-10-CM | POA: Diagnosis not present

## 2021-03-12 DIAGNOSIS — R197 Diarrhea, unspecified: Secondary | ICD-10-CM | POA: Diagnosis not present

## 2021-03-12 DIAGNOSIS — F319 Bipolar disorder, unspecified: Secondary | ICD-10-CM | POA: Diagnosis not present

## 2021-03-12 DIAGNOSIS — E1165 Type 2 diabetes mellitus with hyperglycemia: Secondary | ICD-10-CM | POA: Diagnosis not present

## 2021-03-12 DIAGNOSIS — Z20822 Contact with and (suspected) exposure to covid-19: Secondary | ICD-10-CM | POA: Diagnosis not present

## 2021-03-12 DIAGNOSIS — R42 Dizziness and giddiness: Secondary | ICD-10-CM | POA: Diagnosis not present

## 2021-03-12 DIAGNOSIS — F17210 Nicotine dependence, cigarettes, uncomplicated: Secondary | ICD-10-CM | POA: Diagnosis not present

## 2021-03-14 ENCOUNTER — Encounter
Admission: RE | Disposition: A | Discharge status: Home / Self Care | Payer: Self-pay | Source: Home / Self Care | Attending: General Surgery

## 2021-03-14 ENCOUNTER — Ambulatory Visit: Payer: Medicare HMO | Admitting: Certified Registered Nurse Anesthetist

## 2021-03-14 ENCOUNTER — Other Ambulatory Visit: Payer: Self-pay

## 2021-03-14 ENCOUNTER — Encounter: Payer: Self-pay | Admitting: General Surgery

## 2021-03-14 ENCOUNTER — Ambulatory Visit
Admission: RE | Admit: 2021-03-14 | Discharge: 2021-03-14 | Disposition: A | Discharge status: Home / Self Care | Payer: Medicare HMO | Attending: General Surgery | Admitting: General Surgery

## 2021-03-14 DIAGNOSIS — Z794 Long term (current) use of insulin: Secondary | ICD-10-CM | POA: Insufficient documentation

## 2021-03-14 DIAGNOSIS — K8020 Calculus of gallbladder without cholecystitis without obstruction: Secondary | ICD-10-CM | POA: Diagnosis not present

## 2021-03-14 DIAGNOSIS — E119 Type 2 diabetes mellitus without complications: Secondary | ICD-10-CM | POA: Diagnosis not present

## 2021-03-14 DIAGNOSIS — Z7984 Long term (current) use of oral hypoglycemic drugs: Secondary | ICD-10-CM | POA: Diagnosis not present

## 2021-03-14 DIAGNOSIS — K660 Peritoneal adhesions (postprocedural) (postinfection): Secondary | ICD-10-CM | POA: Insufficient documentation

## 2021-03-14 DIAGNOSIS — F17210 Nicotine dependence, cigarettes, uncomplicated: Secondary | ICD-10-CM | POA: Diagnosis not present

## 2021-03-14 DIAGNOSIS — Z833 Family history of diabetes mellitus: Secondary | ICD-10-CM | POA: Diagnosis not present

## 2021-03-14 DIAGNOSIS — Z79899 Other long term (current) drug therapy: Secondary | ICD-10-CM | POA: Insufficient documentation

## 2021-03-14 DIAGNOSIS — K219 Gastro-esophageal reflux disease without esophagitis: Secondary | ICD-10-CM | POA: Diagnosis not present

## 2021-03-14 DIAGNOSIS — K8010 Calculus of gallbladder with chronic cholecystitis without obstruction: Secondary | ICD-10-CM | POA: Diagnosis not present

## 2021-03-14 LAB — GLUCOSE, CAPILLARY
Glucose-Capillary: 358 mg/dL — ABNORMAL HIGH (ref 70–99)
Glucose-Capillary: 324 mg/dL — ABNORMAL HIGH (ref 70–99)

## 2021-03-14 SURGERY — CHOLECYSTECTOMY, ROBOT-ASSISTED, LAPAROSCOPIC
Anesthesia: General | Site: Abdomen

## 2021-03-14 MED ORDER — CEFAZOLIN SODIUM-DEXTROSE 2-4 GM/100ML-% IV SOLN
2.0000 g | INTRAVENOUS | Status: AC
  Administered 2021-03-14: 3 g via INTRAVENOUS

## 2021-03-14 MED ORDER — ESMOLOL HCL 100 MG/10ML IV SOLN
INTRAVENOUS | Status: DC | PRN
  Administered 2021-03-14 (×3): 10 mg via INTRAVENOUS

## 2021-03-14 MED ORDER — INSULIN ASPART 100 UNIT/ML IJ SOLN: 5.0000 [IU] | Freq: Once | INTRAMUSCULAR | Status: AC

## 2021-03-14 MED ORDER — CEFAZOLIN SODIUM-DEXTROSE 2-4 GM/100ML-% IV SOLN
INTRAVENOUS | Status: AC
  Filled 2021-03-14: qty 100

## 2021-03-14 MED ORDER — MIDAZOLAM HCL 2 MG/2ML IJ SOLN
INTRAMUSCULAR | Status: AC
  Filled 2021-03-14: qty 2

## 2021-03-14 MED ORDER — ONDANSETRON HCL 4 MG/2ML IJ SOLN: 4.0000 mg | Freq: Once | INTRAMUSCULAR | Status: AC | PRN

## 2021-03-14 MED ORDER — LIDOCAINE HCL (PF) 2 % IJ SOLN
INTRAMUSCULAR | Status: AC
  Filled 2021-03-14: qty 5

## 2021-03-14 MED ORDER — ONDANSETRON HCL 4 MG/2ML IJ SOLN
INTRAMUSCULAR | Status: AC
  Filled 2021-03-14: qty 2

## 2021-03-14 MED ORDER — ALBUTEROL SULFATE HFA 108 (90 BASE) MCG/ACT IN AERS
INHALATION_SPRAY | RESPIRATORY_TRACT | Status: DC | PRN
  Administered 2021-03-14: 6 via RESPIRATORY_TRACT

## 2021-03-14 MED ORDER — FENTANYL CITRATE (PF) 100 MCG/2ML IJ SOLN
INTRAMUSCULAR | Status: AC
  Filled 2021-03-14: qty 2

## 2021-03-14 MED ORDER — SUCCINYLCHOLINE CHLORIDE 20 MG/ML IJ SOLN
INTRAMUSCULAR | Status: DC | PRN
  Administered 2021-03-14: 200 mg via INTRAVENOUS

## 2021-03-14 MED ORDER — INSULIN ASPART 100 UNIT/ML IJ SOLN
INTRAMUSCULAR | Status: AC
  Filled 2021-03-14: qty 1

## 2021-03-14 MED ORDER — FENTANYL CITRATE (PF) 100 MCG/2ML IJ SOLN
INTRAMUSCULAR | Status: AC
  Administered 2021-03-14: 25 ug via INTRAVENOUS
  Filled 2021-03-14: qty 2

## 2021-03-14 MED ORDER — HYDROCODONE-ACETAMINOPHEN 5-325 MG PO TABS: 1.0000 | ORAL_TABLET | ORAL | 0 refills | Status: AC | PRN

## 2021-03-14 MED ORDER — PROPOFOL 10 MG/ML IV BOLUS
INTRAVENOUS | Status: DC | PRN
  Administered 2021-03-14: 250 mg via INTRAVENOUS

## 2021-03-14 MED ORDER — INDOCYANINE GREEN 25 MG IV SOLR
2.5000 mg | Freq: Once | INTRAVENOUS | Status: AC
  Administered 2021-03-14: 2.5 mg via INTRAVENOUS
  Filled 2021-03-14: qty 1

## 2021-03-14 MED ORDER — DEXAMETHASONE SODIUM PHOSPHATE 10 MG/ML IJ SOLN
INTRAMUSCULAR | Status: AC
  Filled 2021-03-14: qty 1

## 2021-03-14 MED ORDER — SUGAMMADEX SODIUM 500 MG/5ML IV SOLN
INTRAVENOUS | Status: AC
  Filled 2021-03-14: qty 5

## 2021-03-14 MED ORDER — PROPOFOL 10 MG/ML IV BOLUS
INTRAVENOUS | Status: AC
  Filled 2021-03-14: qty 20

## 2021-03-14 MED ORDER — ROCURONIUM BROMIDE 100 MG/10ML IV SOLN
INTRAVENOUS | Status: DC | PRN
  Administered 2021-03-14: 20 mg via INTRAVENOUS
  Administered 2021-03-14: 50 mg via INTRAVENOUS
  Administered 2021-03-14: 10 mg via INTRAVENOUS
  Administered 2021-03-14: 20 mg via INTRAVENOUS

## 2021-03-14 MED ORDER — ESMOLOL HCL 100 MG/10ML IV SOLN
INTRAVENOUS | Status: AC
  Filled 2021-03-14: qty 10

## 2021-03-14 MED ORDER — ACETAMINOPHEN 10 MG/ML IV SOLN
INTRAVENOUS | Status: DC | PRN
  Administered 2021-03-14: 1000 mg via INTRAVENOUS

## 2021-03-14 MED ORDER — DEXMEDETOMIDINE (PRECEDEX) IN NS 20 MCG/5ML (4 MCG/ML) IV SYRINGE
PREFILLED_SYRINGE | INTRAVENOUS | Status: AC
  Filled 2021-03-14: qty 5

## 2021-03-14 MED ORDER — OXYCODONE HCL 5 MG/5ML PO SOLN: 5.0000 mg | Freq: Once | ORAL | Status: DC | PRN

## 2021-03-14 MED ORDER — SUCCINYLCHOLINE CHLORIDE 200 MG/10ML IV SOSY
PREFILLED_SYRINGE | INTRAVENOUS | Status: AC
  Filled 2021-03-14: qty 20

## 2021-03-14 MED ORDER — CEFAZOLIN SODIUM 1 G IJ SOLR
INTRAMUSCULAR | Status: AC
  Filled 2021-03-14: qty 10

## 2021-03-14 MED ORDER — ACETAMINOPHEN 10 MG/ML IV SOLN
INTRAVENOUS | Status: AC
  Filled 2021-03-14: qty 100

## 2021-03-14 MED ORDER — OXYCODONE HCL 5 MG PO TABS: 5.0000 mg | ORAL_TABLET | Freq: Once | ORAL | Status: DC | PRN

## 2021-03-14 MED ORDER — LIDOCAINE HCL (CARDIAC) PF 100 MG/5ML IV SOSY
PREFILLED_SYRINGE | INTRAVENOUS | Status: DC | PRN
  Administered 2021-03-14: 100 mg via INTRAVENOUS

## 2021-03-14 MED ORDER — KETOROLAC TROMETHAMINE 30 MG/ML IJ SOLN
INTRAMUSCULAR | Status: DC | PRN
  Administered 2021-03-14: 30 mg via INTRAVENOUS

## 2021-03-14 MED ORDER — BUPIVACAINE-EPINEPHRINE 0.25% -1:200000 IJ SOLN
INTRAMUSCULAR | Status: DC | PRN
  Administered 2021-03-14: 10 mL

## 2021-03-14 MED ORDER — ONDANSETRON HCL 4 MG/2ML IJ SOLN
INTRAMUSCULAR | Status: AC
  Administered 2021-03-14: 4 mg via INTRAVENOUS
  Filled 2021-03-14: qty 2

## 2021-03-14 MED ORDER — FENTANYL CITRATE (PF) 100 MCG/2ML IJ SOLN
INTRAMUSCULAR | Status: DC | PRN
  Administered 2021-03-14: 25 ug via INTRAVENOUS
  Administered 2021-03-14: 50 ug via INTRAVENOUS
  Administered 2021-03-14: 25 ug via INTRAVENOUS
  Administered 2021-03-14: 50 ug via INTRAVENOUS
  Administered 2021-03-14: 100 ug via INTRAVENOUS
  Administered 2021-03-14: 50 ug via INTRAVENOUS

## 2021-03-14 MED ORDER — FENTANYL CITRATE (PF) 100 MCG/2ML IJ SOLN
25.0000 ug | INTRAMUSCULAR | Status: DC | PRN
  Administered 2021-03-14 (×2): 25 ug via INTRAVENOUS

## 2021-03-14 MED ORDER — ORAL CARE MOUTH RINSE: 15.0000 mL | Freq: Once | OROMUCOSAL | Status: AC

## 2021-03-14 MED ORDER — BUPIVACAINE-EPINEPHRINE (PF) 0.25% -1:200000 IJ SOLN
INTRAMUSCULAR | Status: AC
  Filled 2021-03-14: qty 30

## 2021-03-14 MED ORDER — KETOROLAC TROMETHAMINE 30 MG/ML IJ SOLN
INTRAMUSCULAR | Status: AC
  Filled 2021-03-14: qty 1

## 2021-03-14 MED ORDER — ONDANSETRON HCL 4 MG/2ML IJ SOLN
INTRAMUSCULAR | Status: DC | PRN
  Administered 2021-03-14: 4 mg via INTRAVENOUS

## 2021-03-14 MED ORDER — CHLORHEXIDINE GLUCONATE 0.12 % MT SOLN
15.0000 mL | Freq: Once | OROMUCOSAL | Status: AC
  Administered 2021-03-14: 15 mL via OROMUCOSAL

## 2021-03-14 MED ORDER — MIDAZOLAM HCL 2 MG/2ML IJ SOLN
INTRAMUSCULAR | Status: DC | PRN
  Administered 2021-03-14: 2 mg via INTRAVENOUS

## 2021-03-14 MED ORDER — DEXMEDETOMIDINE (PRECEDEX) IN NS 20 MCG/5ML (4 MCG/ML) IV SYRINGE
PREFILLED_SYRINGE | INTRAVENOUS | Status: DC | PRN
  Administered 2021-03-14 (×4): 8 ug via INTRAVENOUS
  Administered 2021-03-14: 4 ug via INTRAVENOUS

## 2021-03-14 MED ORDER — INSULIN ASPART 100 UNIT/ML IJ SOLN
INTRAMUSCULAR | Status: AC
  Administered 2021-03-14: 5 [IU] via SUBCUTANEOUS
  Filled 2021-03-14: qty 1

## 2021-03-14 MED ORDER — SUGAMMADEX SODIUM 500 MG/5ML IV SOLN
INTRAVENOUS | Status: DC | PRN
  Administered 2021-03-14: 500 mg via INTRAVENOUS

## 2021-03-14 MED ORDER — INSULIN ASPART 100 UNIT/ML IJ SOLN
5.0000 [IU] | Freq: Once | INTRAMUSCULAR | Status: AC
  Administered 2021-03-14: 5 [IU] via SUBCUTANEOUS

## 2021-03-14 MED ORDER — SODIUM CHLORIDE 0.9 % IV SOLN: INTRAVENOUS | Status: DC

## 2021-03-14 MED ORDER — CHLORHEXIDINE GLUCONATE 0.12 % MT SOLN
OROMUCOSAL | Status: AC
  Filled 2021-03-14: qty 15

## 2021-03-14 SURGICAL SUPPLY — 54 items
ADH SKN CLS APL DERMABOND .7 (GAUZE/BANDAGES/DRESSINGS) ×1
APL PRP STRL LF DISP 70% ISPRP (MISCELLANEOUS) ×1
BAG INFUSER PRESSURE 100CC (MISCELLANEOUS) IMPLANT
BAG SPEC RTRVL LRG 6X4 10 (ENDOMECHANICALS) ×1
BLADE SURG SZ11 CARB STEEL (BLADE) ×2 IMPLANT
CANISTER SUCT 1200ML W/VALVE (MISCELLANEOUS) ×2 IMPLANT
CANNULA REDUC XI 12-8 STAPL (CANNULA) ×1
CANNULA REDUCER 12-8 DVNC XI (CANNULA) ×1 IMPLANT
CHLORAPREP W/TINT 26 (MISCELLANEOUS) ×2 IMPLANT
CLIP VESOLOCK LG 6/CT PURPLE (CLIP) ×2 IMPLANT
CLIP VESOLOCK MED LG 6/CT (CLIP) ×2 IMPLANT
COVER WAND RF STERILE (DRAPES) ×2 IMPLANT
DECANTER SPIKE VIAL GLASS SM (MISCELLANEOUS) ×4 IMPLANT
DEFOGGER SCOPE WARMER CLEARIFY (MISCELLANEOUS) ×2 IMPLANT
DERMABOND ADVANCED (GAUZE/BANDAGES/DRESSINGS) ×1
DERMABOND ADVANCED .7 DNX12 (GAUZE/BANDAGES/DRESSINGS) ×1 IMPLANT
DRAPE ARM DVNC X/XI (DISPOSABLE) ×4 IMPLANT
DRAPE COLUMN DVNC XI (DISPOSABLE) ×1 IMPLANT
DRAPE DA VINCI XI ARM (DISPOSABLE) ×4
DRAPE DA VINCI XI COLUMN (DISPOSABLE) ×1
ELECT REM PT RETURN 9FT ADLT (ELECTROSURGICAL) ×2
ELECTRODE REM PT RTRN 9FT ADLT (ELECTROSURGICAL) ×1 IMPLANT
GLOVE SURG ENC MOIS LTX SZ6.5 (GLOVE) ×8 IMPLANT
GLOVE SURG UNDER POLY LF SZ6.5 (GLOVE) ×8 IMPLANT
GOWN STRL REUS W/ TWL LRG LVL3 (GOWN DISPOSABLE) ×4 IMPLANT
GOWN STRL REUS W/TWL LRG LVL3 (GOWN DISPOSABLE) ×8
GRASPER SUT TROCAR 14GX15 (MISCELLANEOUS) ×2 IMPLANT
IRRIGATOR SUCT 8 DISP DVNC XI (IRRIGATION / IRRIGATOR) IMPLANT
IRRIGATOR SUCTION 8MM XI DISP (IRRIGATION / IRRIGATOR)
IV NS 1000ML (IV SOLUTION)
IV NS 1000ML BAXH (IV SOLUTION) IMPLANT
KIT PINK PAD W/HEAD ARE REST (MISCELLANEOUS) ×2
KIT PINK PAD W/HEAD ARM REST (MISCELLANEOUS) ×1 IMPLANT
LABEL OR SOLS (LABEL) ×2 IMPLANT
MANIFOLD NEPTUNE II (INSTRUMENTS) ×2 IMPLANT
NEEDLE HYPO 22GX1.5 SAFETY (NEEDLE) ×2 IMPLANT
NEEDLE INSUFFLATION 14GA 120MM (NEEDLE) ×2 IMPLANT
NS IRRIG 500ML POUR BTL (IV SOLUTION) ×2 IMPLANT
OBTURATOR OPTICAL STANDARD 8MM (TROCAR) ×1
OBTURATOR OPTICAL STND 8 DVNC (TROCAR) ×1
OBTURATOR OPTICALSTD 8 DVNC (TROCAR) ×1 IMPLANT
PACK LAP CHOLECYSTECTOMY (MISCELLANEOUS) ×2 IMPLANT
POUCH SPECIMEN RETRIEVAL 10MM (ENDOMECHANICALS) ×2 IMPLANT
SEAL CANN UNIV 5-8 DVNC XI (MISCELLANEOUS) ×3 IMPLANT
SEAL XI 5MM-8MM UNIVERSAL (MISCELLANEOUS) ×3
SET TUBE SMOKE EVAC HIGH FLOW (TUBING) ×2 IMPLANT
SOLUTION ELECTROLUBE (MISCELLANEOUS) ×2 IMPLANT
SPONGE LAP 4X18 RFD (DISPOSABLE) IMPLANT
STAPLER CANNULA SEAL DVNC XI (STAPLE) ×1 IMPLANT
STAPLER CANNULA SEAL XI (STAPLE) ×1
SUT MNCRL 4-0 (SUTURE) ×2
SUT MNCRL 4-0 27XMFL (SUTURE) ×1
SUT VICRYL 0 AB UR-6 (SUTURE) ×2 IMPLANT
SUTURE MNCRL 4-0 27XMF (SUTURE) ×1 IMPLANT

## 2021-03-14 NOTE — Discharge Instructions (Signed)
  Diet: Resume home heart healthy regular diet.   Activity: No heavy lifting >20 pounds (children, pets, laundry, garbage) or strenuous activity until follow-up, but light activity and walking are encouraged. Do not drive or drink alcohol if taking narcotic pain medications.  Wound care: May shower with soapy water and pat dry (do not rub incisions), but no baths or submerging incision underwater until follow-up. (no swimming)   Medications: Resume all home medications. For mild to moderate pain: acetaminophen (Tylenol) or ibuprofen (if no kidney disease). Combining Tylenol with alcohol can substantially increase your risk of causing liver disease. Narcotic pain medications, if prescribed, can be used for severe pain, though may cause nausea, constipation, and drowsiness. Do not combine Tylenol and Norco within a 6 hour period as Norco contains Tylenol. If you do not need the narcotic pain medication, you do not need to fill the prescription.  Call office ([phone]) at any time if any questions, worsening pain, fevers/chills, bleeding, drainage from incision site, or other concerns.   AMBULATORY SURGERY  DISCHARGE INSTRUCTIONS   1) The drugs that you were given will stay in your system until tomorrow so for the next 24 hours you should not:  A) Drive an automobile B) Make any legal decisions C) Drink any alcoholic beverage   2) You may resume regular meals tomorrow.  Today it is better to start with liquids and gradually work up to solid foods.  You may eat anything you prefer, but it is better to start with liquids, then soup and crackers, and gradually work up to solid foods.   3) Please notify your doctor immediately if you have any unusual bleeding, trouble breathing, redness and pain at the surgery site, drainage, fever, or pain not relieved by medication.    4) Additional Instructions:        Please contact your physician with any problems or Same Day Surgery at  [phone], Monday through Friday 6 am to 4 pm, or [place] at  Main number at [phone]. 

## 2021-03-14 NOTE — H&P (Signed)
PATIENT PROFILE: Aaron Braun is a 50 y.o. male who presents to the OR for surgical management of cholelithiasis.  PCP: Aaron Form, MD  HISTORY OF PRESENT ILLNESS: Mr. Aaron Braun reports having pain in his abdomen since couple of years ago. He reports that he initially was intermittent but he has been getting frequent the last 2 weeks. He reported the pain is in the epigastric area. Pain radiates to her right upper quadrant. Pain radiates to his back. Pain is exacerbated by eating. He at this point he basically cannot tolerate any type of food. He has tried changing different type of diets with low-fat diet without any improvement of his pain. There has been no alleviating factors. Patient has tried Pepto-Bismol. His primary care increase Protonix which helped with some of the burping but has not improved his pain in the right upper quadrant. He had a CT scan of the abdomen that shows abundant amount of stones in his gallbladder. There was no sign of cholecystitis. The patient denies any fever or chills.  PROBLEM LIST: Problem List Date Reviewed: 09/15/2020  Noted  Borderline personality disorder (CMS-HCC) 05/11/2020  Bipolar 1 disorder (CMS-HCC) 05/11/2020  Anxiety 05/11/2020  Uncontrolled type 2 diabetes mellitus with hyperglycemia (CMS-HCC) 08/26/2019  Mixed hyperlipidemia 08/26/2019  Essential hypertension, benign 06/26/2017  Class 3 severe obesity due to excess calories with serious comorbidity and body mass index (BMI) of 45.0 to 49.9 in adult (CMS-HCC) 06/26/2017  Tobacco use disorder 06/26/2017    GENERAL REVIEW OF SYSTEMS:   General ROS: negative for - chills, fatigue, fever, weight gain or weight loss Allergy and Immunology ROS: negative for - hives  Hematological and Lymphatic ROS: negative for - bleeding problems or bruising, negative for palpable nodes Endocrine ROS: negative for - heat or cold intolerance, hair changes Respiratory ROS: negative for - cough,  shortness of breath or wheezing Cardiovascular ROS: no chest pain or palpitations GI ROS: negative for nausea, vomiting, diarrhea, constipation. Positive for abdominal pain. Musculoskeletal ROS: negative for - joint swelling or muscle pain Neurological ROS: negative for - confusion, syncope Dermatological ROS: negative for pruritus and rash Psychiatric: negative for anxiety, depression, difficulty sleeping and memory loss  MEDICATIONS: Current Outpatient Medications  Medication Sig Dispense Refill  . atorvastatin (LIPITOR) 20 MG tablet Take 1 tablet (20 mg total) by mouth once daily 90 tablet 1  . blood glucose meter kit Use as directed 1 each 0  . blood sugar diagnostic, drum (BLOOD GLUCOSE DIAGNOSTIC, DRUM) test strip Use 3 (three) times daily Use as instructed. 300 each 6  . divalproex (DEPAKOTE) 500 MG DR tablet Take 1 tablet (500 mg total) by mouth as directed Take 2 tablets (1000 mg) in the morning and 1 tablet ($RemoveB'500mg'mVmHYrsh$ ) in the evening 270 tablet 2  . flash glucose scanning (FREESTYLE LIBRE 2 READER) reader Use 1 Device as directed 1 each 0  . flash glucose sensor (FREESTYLE LIBRE 2 SENSOR) kit Use 1 kit every 14 (fourteen) days for glucose monitoring 6 kit 2  . glipiZIDE (GLUCOTROL XL) 10 MG XL tablet Take 1 tablet (10 mg total) by mouth once daily 90 tablet 3  . insulin ASPART (NOVOLOG FLEXPEN) pen injector (concentration 100 units/mL) Inject 5 Units subcutaneously 3 (three) times daily with meals Plus sliding scale. MDD: 51 units daily 45 mL 1  . lancets Use 1 each 3 (three) times daily Use as instructed. 300 each 6  . LANTUS SOLOSTAR U-100 INSULIN pen injector (concentration 100 units/mL) Inject 40 Units subcutaneously  nightly (Patient taking differently: Inject 50 Units subcutaneously nightly) 45 mL 3  . loratadine (CLARITIN) 10 mg tablet Take 1 tablet (10 mg total) by mouth once daily 90 tablet 3  . losartan (COZAAR) 50 MG tablet Take 1 tablet (50 mg total) by mouth once daily 90  tablet 1  . mometasone (NASONEX) 50 mcg/actuation nasal spray Place 2 sprays into both nostrils once daily 51 g 1  . multivitamin tablet Take 1 tablet by mouth once daily  . omeprazole (PRILOSEC) 40 MG DR capsule Take 1 capsule (40 mg total) by mouth 2 (two) times daily before meals 180 capsule 3  . pen needle, diabetic 32 gauge x 5/16" needle Use 4 (four) times daily 400 each 3  . traZODone (DESYREL) 50 MG tablet Take 1 tablet (50 mg total) by mouth nightly 90 tablet 3  . codeine-guaifenesin 10-100 mg/5 mL oral liquid Take 5 mLs by mouth every 6 (six) hours as needed for Cough (Patient not taking: No sig reported) 118 mL 0  . liraglutide (VICTOZA) 0.6 mg/0.1 mL (18 mg/3 mL) pen injector Inject 1.8 mg subcutaneously once daily for 30 days 27 mL 1   No current facility-administered medications for this visit.   ALLERGIES: Mushroom  PAST MEDICAL HISTORY: Past Medical History:  Diagnosis Date  . Allergy  Mushrooms  . Anxiety  . Arthritis  . Depression  . Diabetes mellitus without complication (CMS-HCC)  . Hyperlipidemia  . Hypertension  . Sleep apnea  . Substance abuse (CMS-HCC)  Clean 72yrs   PAST SURGICAL HISTORY: Past Surgical History:  Procedure Laterality Date  . EXTRACTION TEETH Bilateral 1993    FAMILY HISTORY: Family History  Problem Relation Age of Onset  . Thyroid cancer Mother  . Diabetes Mother  . Stroke Mother  . High blood pressure (Hypertension) Mother  . Alzheimer's disease Mother  . Bipolar disorder Mother  . Depression Mother  . Diabetes type II Mother  . Obesity Mother  . Osteoarthritis Mother  . Diabetes Father  . Myocardial Infarction (Heart attack) Father  . Alcohol abuse Father  . Bipolar disorder Father  . Depression Father  . Diabetes type II Father  . High blood pressure (Hypertension) Father  . Obesity Father  . Osteoarthritis Father  . High blood pressure (Hypertension) Brother  . Bipolar disorder Brother  . Depression Brother  . No  Known Problems Brother    SOCIAL HISTORY: Social History   Socioeconomic History  . Marital status: Legally Separated  . Number of children: 2  . Years of education: 12+  . Highest education level: Some college, no degree  Occupational History  . Occupation: Disabled  Tobacco Use  . Smoking status: Current Every Day Smoker  Packs/day: 1.00  Years: 36.00  Pack years: 36.00  Types: Cigarettes  . Smokeless tobacco: Never Used  Vaping Use  . Vaping Use: Former  Substance and Sexual Activity  . Alcohol use: Not Currently  Comment: Clean 61yrs  . Drug use: Not Currently  . Sexual activity: Yes  Partners: Female  Birth control/protection: Condom, Pill   PHYSICAL EXAM: Vitals:  02/24/21 1504  BP: 125/80  Pulse: 95   Body mass index is 50.05 kg/m. Weight: (!) 167.4 kg (369 lb)   GENERAL: Alert, active, oriented x3  HEENT: Pupils equal reactive to light. Extraocular movements are intact. Sclera clear. Palpebral conjunctiva normal red color.Pharynx clear.  NECK: Supple with no palpable mass and no adenopathy.  LUNGS: Sound clear with no rales rhonchi or wheezes.  HEART: Regular rhythm S1 and S2 without murmur.  ABDOMEN: Soft and depressible, nontender with no palpable mass, no hepatomegaly.   EXTREMITIES: Well-developed well-nourished symmetrical with no dependent edema.  NEUROLOGICAL: Awake alert oriented, facial expression symmetrical, moving all extremities.  REVIEW OF DATA: I have reviewed the following data today: Office Visit on 02/22/2021  Component Date Value  . WBC (White Blood Cell Co* 02/22/2021 8.9  . RBC (Red Blood Cell Coun* 02/22/2021 5.38  . Hemoglobin 02/22/2021 16.4  . Hematocrit 02/22/2021 48.7  . MCV (Mean Corpuscular Vo* 02/22/2021 90.5  . MCH (Mean Corpuscular He* 02/22/2021 30.5  . MCHC (Mean Corpuscular H* 02/22/2021 33.7  . Platelet Count 02/22/2021 161  . RDW-CV (Red Cell Distrib* 02/22/2021 12.2  . MPV (Mean Platelet Volum*  02/22/2021 9.8  . Neutrophils 02/22/2021 4.80  . Lymphocytes 02/22/2021 3.15  . Monocytes 02/22/2021 0.57  . Eosinophils 02/22/2021 0.27  . Basophils 02/22/2021 0.07  . Neutrophil % 02/22/2021 54.1  . Lymphocyte % 02/22/2021 35.5  . Monocyte % 02/22/2021 6.4  . Eosinophil % 02/22/2021 3.0  . Basophil% 02/22/2021 0.8  . Immature Granulocyte % 02/22/2021 0.2  . Immature Granulocyte Cou* 02/22/2021 0.02  . Glucose 02/22/2021 386 (!)  . Sodium 02/22/2021 137  . Potassium 02/22/2021 4.3  . Chloride 02/22/2021 103  . Carbon Dioxide (CO2) 02/22/2021 23.3  . Urea Nitrogen (BUN) 02/22/2021 12  . Creatinine 02/22/2021 0.6 (!)  . Glomerular Filtration Ra* 02/22/2021 144  . Calcium 02/22/2021 9.0  . AST 02/22/2021 22  . ALT 02/22/2021 39  . Alk Phos (alkaline Phosp* 02/22/2021 53  . Albumin 02/22/2021 3.8  . Bilirubin, Total 02/22/2021 0.4  . Protein, Total 02/22/2021 6.3  . A/G Ratio 02/22/2021 1.5  . Lipase 02/22/2021 13  Appointment on 01/27/2021  Component Date Value  . Hemoglobin A1C 01/27/2021 10.8 (!)  . Average Blood Glucose (C* 01/27/2021 263  . Glucose 01/27/2021 391 (!)  . Sodium 01/27/2021 140  . Potassium 01/27/2021 4.9  . Chloride 01/27/2021 103  . Carbon Dioxide (CO2) 01/27/2021 27.1  . Urea Nitrogen (BUN) 01/27/2021 15  . Creatinine 01/27/2021 0.6 (!)  . Glomerular Filtration Ra* 01/27/2021 144  . Calcium 01/27/2021 9.0  . AST 01/27/2021 24  . ALT 01/27/2021 33  . Alk Phos (alkaline Phosp* 01/27/2021 57  . Albumin 01/27/2021 3.9  . Bilirubin, Total 01/27/2021 0.4  . Protein, Total 01/27/2021 6.3  . A/G Ratio 01/27/2021 1.6  . Creatinine, Random Urine 01/27/2021 57.1  . Urine Albumin, Random 01/27/2021 46  . Urine Albumin/Creatinine* 01/27/2021 80.6 (!)    ASSESSMENT: Mr. Pellot is a 50 y.o. male presenting for robotic assisted laparoscopic cholecystectomy.   Patient was oriented about the diagnosis of cholelithiasis. Also oriented about what is the  gallbladder, its anatomy and function and the implications of having stones / gallbladder low ejection fraction. The patient was oriented about the treatment alternatives (observation vs cholecystectomy). Patient was oriented that a low percentage of patient will continue to have similar pain symptoms even after the gallbladder is removed. Surgical technique (open vs laparoscopic) was discussed. It was also discussed the goals of the surgery (decrease the pain episodes and avoid the risk of cholecystitis) and the risk of surgery including: bleeding, infection, common bile duct injury, stone retention, injury to other organs such as bowel, liver, stomach, other complications such as hernia, bowel obstruction among others. Also discussed with patient about anesthesia and its complications such as: reaction to medications, pneumonia, heart complications, death, among  others.   Personally evaluated the CT scan and I was able to identify them the amount of stones in his gallbladder. I discussed with the patient that ideally the patient should have his diabetes with better control and stop smoking to decrease the risk of complication such as infection, pulmonary complications and healing but the fact that he cannot tolerate any type of foot without pain he would benefit of having the gallbladder removed. He has been doing great with diet decreasing his weight from the 600s to around 369 pounds. I discussed with the patient that she should seek a weight loss specialist to continue improving his weight loss since most of the time once you get to a point that you get stuck patient will need to change his diet and exercise regimen to continue losing weight healthy. The patient understands his increased risk of complications but he would like to proceed with surgical management.  Cholelithiasis without cholecystitis [K80.20]  PLAN: 1. Robotic assisted laparoscopic cholecystectomy ([id number])  Patient verbalized  understanding, all questions were answered, and were agreeable with the plan outlined above.   Herbert Pun, MD

## 2021-03-14 NOTE — Anesthesia Procedure Notes (Addendum)
Procedure Name: Intubation Date/Time: 03/14/2021 8:48 AM Performed by: Joanette Gula, Rosanne Wohlfarth, CRNA Pre-anesthesia Checklist: Patient identified, Emergency Drugs available, Suction available and Patient being monitored Patient Re-evaluated:Patient Re-evaluated prior to induction Oxygen Delivery Method: Circle system utilized Preoxygenation: Pre-oxygenation with 100% oxygen Induction Type: IV induction Ventilation: Mask ventilation without difficulty Laryngoscope Size: McGraph and 4 Grade View: Grade I Tube type: Oral Tube size: 7.5 mm Number of attempts: 1 Airway Equipment and Method: Stylet and Oral airway Placement Confirmation: ETT inserted through vocal cords under direct vision,  positive ETCO2 and breath sounds checked- equal and bilateral Secured at: 22 cm Tube secured with: Tape Dental Injury: Teeth and Oropharynx as per pre-operative assessment

## 2021-03-14 NOTE — Anesthesia Preprocedure Evaluation (Addendum)
Anesthesia Evaluation  Patient identified by MRN, date of birth, ID band Patient awake    Reviewed: Allergy & Precautions, H&P , NPO status , Patient's Chart, lab work & pertinent test results  History of Anesthesia Complications Negative for: history of anesthetic complications  Airway Mallampati: III  TM Distance: >3 FB Neck ROM: full    Dental  (+) Chipped   Pulmonary sleep apnea , neg COPD, Current Smoker and Patient abstained from smoking.,    breath sounds clear to auscultation       Cardiovascular hypertension, (-) angina(-) Past MI and (-) Cardiac Stents (-) dysrhythmias  Rhythm:regular Rate:Normal     Neuro/Psych PSYCHIATRIC DISORDERS Anxiety Bipolar Disorder negative neurological ROS     GI/Hepatic Neg liver ROS, GERD  ,  Endo/Other  diabetesMorbid obesity (super mobid obesity)  Renal/GU      Musculoskeletal   Abdominal   Peds  Hematology negative hematology ROS (+)   Anesthesia Other Findings Past Medical History: No date: Arthritis No date: Bipolar 1 disorder (HCC) No date: Borderline personality disorder (HCC) No date: Diabetes mellitus without complication (HCC) No date: GERD (gastroesophageal reflux disease) No date: History of substance abuse (HCC)     Comment:  clean since 2009 No date: Hyperlipidemia No date: Hypertension No date: Pneumonia     Comment:  as a child No date: Sleep apnea No date: Uncontrolled type 2 diabetes mellitus with hyperglycemia  (HCC)  Past Surgical History: No date: WISDOM TOOTH EXTRACTION     Comment:  at dentist office  BMI    Body Mass Index: 48.42 kg/m      Reproductive/Obstetrics negative OB ROS                            Anesthesia Physical Anesthesia Plan  ASA: III  Anesthesia Plan: General ETT   Post-op Pain Management:    Induction:   PONV Risk Score and Plan: Ondansetron, Dexamethasone, Midazolam and Treatment may  vary due to age or medical condition  Airway Management Planned:   Additional Equipment:   Intra-op Plan:   Post-operative Plan:   Informed Consent: I have reviewed the patients History and Physical, chart, labs and discussed the procedure including the risks, benefits and alternatives for the proposed anesthesia with the patient or authorized representative who has indicated his/her understanding and acceptance.     Dental Advisory Given  Plan Discussed with: Anesthesiologist, CRNA and Surgeon  Anesthesia Plan Comments:         Anesthesia Quick Evaluation

## 2021-03-14 NOTE — Transfer of Care (Signed)
Immediate Anesthesia Transfer of Care Note  Patient: Aaron Braun  Procedure(s) Performed: XI ROBOTIC ASSISTED LAPAROSCOPIC CHOLECYSTECTOMY (N/A Abdomen)  Patient Location: PACU  Anesthesia Type:General  Level of Consciousness: drowsy and patient cooperative  Airway & Oxygen Therapy: Patient Spontanous Breathing and Patient connected to face mask oxygen  Post-op Assessment: Report given to RN and Post -op Vital signs reviewed and stable  Post vital signs: Reviewed and stable  Last Vitals:  Vitals Value Taken Time  BP 110/57 03/14/21 1049  Temp    Pulse 102 03/14/21 1051  Resp 34 03/14/21 1051  SpO2 97 % 03/14/21 1051  Vitals shown include unvalidated device data.  Last Pain:  Vitals:   03/14/21 0720  TempSrc: Temporal  PainSc: 0-No pain         Complications: No complications documented.

## 2021-03-14 NOTE — Op Note (Signed)
Preoperative diagnosis: Cholelithiasis   Postoperative diagnosis: Cholelithiasis   Procedure: Robotic Assisted Laparoscopic Cholecystectomy.   Anesthesia: GETA   Surgeon: Dr. Hazle Quant  Wound Classification: Clean Contaminated  Indications: Patient is a 51 y.o. male developed right upper quadrant pain and on workup was found to have cholelithiasis with a normal common duct. Robotic Assisted Laparoscopic cholecystectomy was elected.  Findings: Abundant amount of adhesion of omentum of liver and gallbladder   Critical view of safety achieved Cystic duct and artery identified, ligated and divided Nodular liver  Adequate hemostasis  Description of procedure: The patient was placed on the operating table in the supine position. General anesthesia was induced. A time-out was completed verifying correct patient, procedure, site, positioning, and implant(s) and/or special equipment prior to beginning this procedure. An orogastric tube was placed. The abdomen was prepped and draped in the usual sterile fashion.  An incision was made in a natural skin line below the umbilicus.  The fascia was elevated and the Veress needle inserted. Proper position was confirmed by aspiration and saline meniscus test.  The abdomen was insufflated with carbon dioxide to a pressure of 15 mmHg. The patient tolerated insufflation well. A 8-mm trocar was then inserted in optiview fashion.  The laparoscope was inserted and the abdomen inspected. No injuries from initial trocar placement were noted. Additional trocars were then inserted in the following locations: an 8-mm trocar in the left lateral abdomen, and another two 8-mm trocars to the right side of the abdomen 5 cm appart. The umbilical trocar was changed to a 12 mm trocar all under direct visualization. The abdomen was inspected and no abnormalities were found. The table was placed in the reverse Trendelenburg position with the right side up. The robotic arms  were docked and target anatomy identified. Instrument inserted under direct visualization.  Very difficult and time consuming lysis of adhesions between the gallbladder/liver and omentum, duodenum and transverse colon were lysed with electrocautery. Once lysis of adhesions was completed, the dome of the gallbladder was grasped with a prograsp and retracted over the dome of the liver. The infundibulum was also grasped with an atraumatic grasper and retracted toward the right lower quadrant. This maneuver exposed Calot's triangle. The peritoneum overlying the gallbladder infundibulum was then incised and the cystic duct and cystic artery identified and circumferentially dissected. Critical view of safety reviewed before ligating any structure. Firefly images taken to visualize biliary ducts. The cystic duct and cystic artery were then doubly clipped and divided close to the gallbladder.  The gallbladder was then dissected from its peritoneal attachments by electrocautery. Hemostasis was checked and the gallbladder and contained stones were removed using an endoscopic retrieval bag. The gallbladder was passed off the table as a specimen.  There was no evidence of bleeding from the gallbladder fossa or cystic artery or leakage of the bile from the cystic duct stump. Secondary trocars were removed under direct vision. No bleeding was noted. The robotic arms were undoked. The scope was withdrawn and the umbilical trocar removed. The abdomen was allowed to collapse. The fascia of the 43mm trocar sites was closed with figure-of-eight 0 vicryl sutures. The skin was closed with subcuticular sutures of 4-0 monocryl and topical skin adhesive. The orogastric tube was removed.  The patient tolerated the procedure well and was taken to the postanesthesia care unit in stable condition.   Specimen: Gallbladder  Complications: None  EBL: 10 mL

## 2021-03-15 LAB — SURGICAL PATHOLOGY

## 2021-03-15 NOTE — Anesthesia Postprocedure Evaluation (Signed)
Anesthesia Post Note  Patient: Aaron Braun  Procedure(s) Performed: XI ROBOTIC ASSISTED LAPAROSCOPIC CHOLECYSTECTOMY (N/A Abdomen)  Patient location during evaluation: PACU Anesthesia Type: General Level of consciousness: awake and alert Pain management: pain level controlled Vital Signs Assessment: post-procedure vital signs reviewed and stable Respiratory status: spontaneous breathing, nonlabored ventilation and respiratory function stable Cardiovascular status: blood pressure returned to baseline and stable Postop Assessment: no apparent nausea or vomiting Anesthetic complications: no   No complications documented.   Last Vitals:  Vitals:   03/14/21 1346 03/14/21 1554  BP: 120/74 (!) 144/90  Pulse: 91 97  Resp: 20 16  Temp: (!) 36 C (!) 36.3 C  SpO2: 98% 97%    Last Pain:  Vitals:   03/14/21 1554  TempSrc: Temporal  PainSc: 0-No pain                 Aaron Braun

## 2021-03-17 DIAGNOSIS — F4312 Post-traumatic stress disorder, chronic: Secondary | ICD-10-CM | POA: Diagnosis not present

## 2021-03-23 DIAGNOSIS — E1165 Type 2 diabetes mellitus with hyperglycemia: Secondary | ICD-10-CM | POA: Diagnosis not present

## 2021-03-31 DIAGNOSIS — F4312 Post-traumatic stress disorder, chronic: Secondary | ICD-10-CM | POA: Diagnosis not present

## 2021-04-07 DIAGNOSIS — G4733 Obstructive sleep apnea (adult) (pediatric): Secondary | ICD-10-CM | POA: Diagnosis not present

## 2021-04-12 DIAGNOSIS — G4733 Obstructive sleep apnea (adult) (pediatric): Secondary | ICD-10-CM | POA: Diagnosis not present

## 2021-04-14 DIAGNOSIS — F4312 Post-traumatic stress disorder, chronic: Secondary | ICD-10-CM | POA: Diagnosis not present

## 2021-04-21 DIAGNOSIS — F315 Bipolar disorder, current episode depressed, severe, with psychotic features: Secondary | ICD-10-CM | POA: Diagnosis not present

## 2021-04-21 DIAGNOSIS — F4312 Post-traumatic stress disorder, chronic: Secondary | ICD-10-CM | POA: Diagnosis not present

## 2021-04-23 DIAGNOSIS — E1165 Type 2 diabetes mellitus with hyperglycemia: Secondary | ICD-10-CM | POA: Diagnosis not present

## 2021-04-28 DIAGNOSIS — F315 Bipolar disorder, current episode depressed, severe, with psychotic features: Secondary | ICD-10-CM | POA: Diagnosis not present

## 2021-04-28 DIAGNOSIS — F4312 Post-traumatic stress disorder, chronic: Secondary | ICD-10-CM | POA: Diagnosis not present

## 2021-05-05 DIAGNOSIS — F315 Bipolar disorder, current episode depressed, severe, with psychotic features: Secondary | ICD-10-CM | POA: Diagnosis not present

## 2021-05-05 DIAGNOSIS — F4312 Post-traumatic stress disorder, chronic: Secondary | ICD-10-CM | POA: Diagnosis not present

## 2021-05-07 DIAGNOSIS — G4733 Obstructive sleep apnea (adult) (pediatric): Secondary | ICD-10-CM | POA: Diagnosis not present

## 2021-05-12 DIAGNOSIS — F315 Bipolar disorder, current episode depressed, severe, with psychotic features: Secondary | ICD-10-CM | POA: Diagnosis not present

## 2021-05-12 DIAGNOSIS — F4312 Post-traumatic stress disorder, chronic: Secondary | ICD-10-CM | POA: Diagnosis not present

## 2021-05-13 DIAGNOSIS — E782 Mixed hyperlipidemia: Secondary | ICD-10-CM | POA: Diagnosis not present

## 2021-05-13 DIAGNOSIS — Z794 Long term (current) use of insulin: Secondary | ICD-10-CM | POA: Diagnosis not present

## 2021-05-13 DIAGNOSIS — E1165 Type 2 diabetes mellitus with hyperglycemia: Secondary | ICD-10-CM | POA: Diagnosis not present

## 2021-05-13 DIAGNOSIS — I10 Essential (primary) hypertension: Secondary | ICD-10-CM | POA: Diagnosis not present

## 2021-05-13 DIAGNOSIS — F17200 Nicotine dependence, unspecified, uncomplicated: Secondary | ICD-10-CM | POA: Diagnosis not present

## 2021-05-19 DIAGNOSIS — F315 Bipolar disorder, current episode depressed, severe, with psychotic features: Secondary | ICD-10-CM | POA: Diagnosis not present

## 2021-05-19 DIAGNOSIS — F4312 Post-traumatic stress disorder, chronic: Secondary | ICD-10-CM | POA: Diagnosis not present

## 2021-05-23 DIAGNOSIS — E1165 Type 2 diabetes mellitus with hyperglycemia: Secondary | ICD-10-CM | POA: Diagnosis not present

## 2021-06-03 DIAGNOSIS — Z1211 Encounter for screening for malignant neoplasm of colon: Secondary | ICD-10-CM | POA: Diagnosis not present

## 2021-06-03 DIAGNOSIS — I10 Essential (primary) hypertension: Secondary | ICD-10-CM | POA: Diagnosis not present

## 2021-06-03 DIAGNOSIS — E1165 Type 2 diabetes mellitus with hyperglycemia: Secondary | ICD-10-CM | POA: Diagnosis not present

## 2021-06-03 DIAGNOSIS — F319 Bipolar disorder, unspecified: Secondary | ICD-10-CM | POA: Diagnosis not present

## 2021-06-03 DIAGNOSIS — F17200 Nicotine dependence, unspecified, uncomplicated: Secondary | ICD-10-CM | POA: Diagnosis not present

## 2021-06-03 DIAGNOSIS — E785 Hyperlipidemia, unspecified: Secondary | ICD-10-CM | POA: Diagnosis not present

## 2021-06-03 DIAGNOSIS — G4733 Obstructive sleep apnea (adult) (pediatric): Secondary | ICD-10-CM | POA: Diagnosis not present

## 2021-06-03 DIAGNOSIS — Z0000 Encounter for general adult medical examination without abnormal findings: Secondary | ICD-10-CM | POA: Diagnosis not present

## 2021-06-07 DIAGNOSIS — G4733 Obstructive sleep apnea (adult) (pediatric): Secondary | ICD-10-CM | POA: Diagnosis not present

## 2021-06-09 DIAGNOSIS — F4312 Post-traumatic stress disorder, chronic: Secondary | ICD-10-CM | POA: Diagnosis not present

## 2021-06-16 DIAGNOSIS — F4312 Post-traumatic stress disorder, chronic: Secondary | ICD-10-CM | POA: Diagnosis not present

## 2021-06-21 DIAGNOSIS — F19129 Other psychoactive substance abuse with intoxication, unspecified: Secondary | ICD-10-CM | POA: Diagnosis not present

## 2021-06-21 DIAGNOSIS — F250 Schizoaffective disorder, bipolar type: Secondary | ICD-10-CM | POA: Diagnosis not present

## 2021-06-23 DIAGNOSIS — E1165 Type 2 diabetes mellitus with hyperglycemia: Secondary | ICD-10-CM | POA: Diagnosis not present

## 2021-06-30 DIAGNOSIS — F4312 Post-traumatic stress disorder, chronic: Secondary | ICD-10-CM | POA: Diagnosis not present

## 2021-07-07 DIAGNOSIS — F4312 Post-traumatic stress disorder, chronic: Secondary | ICD-10-CM | POA: Diagnosis not present

## 2021-07-08 DIAGNOSIS — G4733 Obstructive sleep apnea (adult) (pediatric): Secondary | ICD-10-CM | POA: Diagnosis not present

## 2021-07-11 DIAGNOSIS — F4312 Post-traumatic stress disorder, chronic: Secondary | ICD-10-CM | POA: Diagnosis not present

## 2021-07-13 DIAGNOSIS — G4733 Obstructive sleep apnea (adult) (pediatric): Secondary | ICD-10-CM | POA: Diagnosis not present

## 2021-07-18 DIAGNOSIS — F250 Schizoaffective disorder, bipolar type: Secondary | ICD-10-CM | POA: Diagnosis not present

## 2021-07-18 DIAGNOSIS — F1990 Other psychoactive substance use, unspecified, uncomplicated: Secondary | ICD-10-CM | POA: Diagnosis not present

## 2021-07-24 DIAGNOSIS — E1165 Type 2 diabetes mellitus with hyperglycemia: Secondary | ICD-10-CM | POA: Diagnosis not present

## 2021-07-25 DIAGNOSIS — G8929 Other chronic pain: Secondary | ICD-10-CM | POA: Diagnosis not present

## 2021-07-25 DIAGNOSIS — M25512 Pain in left shoulder: Secondary | ICD-10-CM | POA: Diagnosis not present

## 2021-07-25 DIAGNOSIS — E119 Type 2 diabetes mellitus without complications: Secondary | ICD-10-CM | POA: Diagnosis not present

## 2021-07-25 DIAGNOSIS — I10 Essential (primary) hypertension: Secondary | ICD-10-CM | POA: Diagnosis not present

## 2021-07-25 DIAGNOSIS — F17210 Nicotine dependence, cigarettes, uncomplicated: Secondary | ICD-10-CM | POA: Diagnosis not present

## 2021-07-28 ENCOUNTER — Ambulatory Visit (INDEPENDENT_AMBULATORY_CARE_PROVIDER_SITE_OTHER): Payer: Medicare HMO | Admitting: Orthopaedic Surgery

## 2021-07-28 ENCOUNTER — Other Ambulatory Visit: Payer: Self-pay

## 2021-07-28 ENCOUNTER — Ambulatory Visit: Payer: Self-pay

## 2021-07-28 ENCOUNTER — Encounter: Payer: Self-pay | Admitting: Orthopaedic Surgery

## 2021-07-28 DIAGNOSIS — G8929 Other chronic pain: Secondary | ICD-10-CM

## 2021-07-28 DIAGNOSIS — M25512 Pain in left shoulder: Secondary | ICD-10-CM | POA: Diagnosis not present

## 2021-07-28 MED ORDER — DICLOFENAC SODIUM 75 MG PO TBEC: 75.0000 mg | DELAYED_RELEASE_TABLET | Freq: Two times a day (BID) | ORAL | 2 refills | Status: AC

## 2021-07-28 MED ORDER — METHOCARBAMOL 750 MG PO TABS: 750.0000 mg | ORAL_TABLET | Freq: Two times a day (BID) | ORAL | 3 refills | Status: AC | PRN

## 2021-07-28 NOTE — Progress Notes (Signed)
Office Visit Note   Patient: Aaron Braun           Date of Birth: 06-23-1971           MRN: [id number] Visit Date: 07/28/2021              Requested by: Mick Sell, MD [address] [place],  [place] [address] PCP: Mick Sell, MD   Assessment & Plan: Visit Diagnoses:  1. Chronic left shoulder pain   2. Acute pain of left shoulder     Plan: Impression is acute left shoulder pain.  Based on findings I feel that this is mainly muscular.  He could be early adhesive capsulitis specially since he has uncontrolled diabetes.  I would prefer to start him off with an NSAID because of his uncontrolled diabetes.  Prescription for Robaxin sent in as well.  We gave him a referral to outpatient PT at [facility] in [place].  We will see him back as needed.  Follow-Up Instructions: Return if symptoms worsen or fail to improve.   Orders:  Orders Placed This Encounter  Procedures   XR Shoulder Left   Meds ordered this encounter  Medications   methocarbamol (ROBAXIN) 750 MG tablet    Sig: Take 1 tablet (750 mg total) by mouth 2 (two) times daily as needed for muscle spasms.    Dispense:  20 tablet    Refill:  3   diclofenac (VOLTAREN) 75 MG EC tablet    Sig: Take 1 tablet (75 mg total) by mouth 2 (two) times daily.    Dispense:  30 tablet    Refill:  2      Procedures: No procedures performed   Clinical Data: No additional findings.   Subjective: Chief Complaint  Patient presents with   Left Shoulder - Pain    HPI  Aaron Braun is a 50 year old gentleman who comes in for evaluation of of left shoulder pain for about 2 and half weeks.  Denies any injuries.  He states that he woke up with the pain and has not been able to move his left arm or shoulder much.  Denies any recent illnesses.  He went [facility] ED for evaluation.  X-rays were normal and he was placed in a sling.  He also endorses posterior midline neck pain that radiates down the back of the  shoulder and into the elbow.  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  He has uncontrolled diabetes with previous A1c of 11.1.  Review of Systems  Constitutional: Negative.   All other systems reviewed and are negative.   Objective: Vital Signs: There were no vitals taken for this visit.  Physical Exam Vitals and nursing note reviewed.  Constitutional:      Appearance: He is well-developed.  Pulmonary:     Effort: Pulmonary effort is normal.  Abdominal:     Palpations: Abdomen is soft.  Skin:    General: Skin is warm.  Neurological:     Mental Status: He is alert and oriented to person, place, and time.  Psychiatric:        Behavior: Behavior normal.        Thought Content: Thought content normal.        Judgment: Judgment normal.    Ortho Exam  Left shoulder shows a lot of guarding and apprehension to passive range of motion.  Manual muscle testing feels like is grossly intact with decreased  strength secondary to guarding and apprehension.  Negative Hawkins sign.  He has pain throughout the shoulder mostly localized to the scapular region.  Negative Spurling's.  Specialty Comments:  No specialty comments available.  Imaging: XR Shoulder Left  Result Date: 07/28/2021 No acute or structural abnormalities    PMFS History: Patient Active Problem List   Diagnosis Date Noted   Bipolar depression (HCC) 05/12/2020   Bipolar 1 disorder (HCC) 05/11/2020   Anxiety 05/11/2020   Borderline personality disorder (HCC) 05/11/2020   Uncontrolled type 2 diabetes mellitus with hyperglycemia (HCC) 08/26/2019   Mixed hyperlipidemia 08/26/2019   Essential hypertension, benign 08/26/2019   Past Medical History:  Diagnosis Date   Arthritis    Bipolar 1 disorder (HCC)    Borderline personality disorder (HCC)    Diabetes mellitus without complication (HCC)    GERD (gastroesophageal reflux disease)    History of substance abuse (HCC)    clean since 2009    Hyperlipidemia    Hypertension    Pneumonia    as a child   Sleep apnea    Uncontrolled type 2 diabetes mellitus with hyperglycemia (HCC)     Family History  Problem Relation Age of Onset   Diabetes Mother    Thyroid disease Mother    Hypertension Mother    Hyperlipidemia Mother    CAD Mother    Kidney disease Mother    Stroke Mother    Heart attack Mother    Diabetes Father    Hypertension Father    Hyperlipidemia Father    Heart attack Father    CAD Father     Past Surgical History:  Procedure Laterality Date   WISDOM TOOTH EXTRACTION     at dentist office   Social History   Occupational History   Not on file  Tobacco Use   Smoking status: Every Day    Packs/day: 1.00    Years: 35.00    Pack years: 35.00    Types: Cigarettes   Smokeless tobacco: Never  Vaping Use   Vaping Use: Never used  Substance and Sexual Activity   Alcohol use: Not Currently    Comment: last used 2009   Drug use: Not Currently    Types: Cocaine, Marijuana    Comment: last used 2009   Sexual activity: Not on file

## 2021-08-07 DIAGNOSIS — G4733 Obstructive sleep apnea (adult) (pediatric): Secondary | ICD-10-CM | POA: Diagnosis not present

## 2021-09-07 DIAGNOSIS — G4733 Obstructive sleep apnea (adult) (pediatric): Secondary | ICD-10-CM | POA: Diagnosis not present

## 2021-09-09 DIAGNOSIS — F419 Anxiety disorder, unspecified: Secondary | ICD-10-CM | POA: Diagnosis not present

## 2021-09-09 DIAGNOSIS — Z8659 Personal history of other mental and behavioral disorders: Secondary | ICD-10-CM | POA: Diagnosis not present

## 2021-09-09 DIAGNOSIS — F3170 Bipolar disorder, currently in remission, most recent episode unspecified: Secondary | ICD-10-CM | POA: Diagnosis not present

## 2021-09-09 DIAGNOSIS — F319 Bipolar disorder, unspecified: Secondary | ICD-10-CM | POA: Diagnosis not present

## 2021-09-09 DIAGNOSIS — R45851 Suicidal ideations: Secondary | ICD-10-CM | POA: Diagnosis not present

## 2021-09-09 DIAGNOSIS — F17210 Nicotine dependence, cigarettes, uncomplicated: Secondary | ICD-10-CM | POA: Diagnosis not present

## 2021-09-09 DIAGNOSIS — Z794 Long term (current) use of insulin: Secondary | ICD-10-CM | POA: Diagnosis not present

## 2021-09-09 DIAGNOSIS — I10 Essential (primary) hypertension: Secondary | ICD-10-CM | POA: Diagnosis not present

## 2021-09-09 DIAGNOSIS — E785 Hyperlipidemia, unspecified: Secondary | ICD-10-CM | POA: Diagnosis not present

## 2021-09-09 DIAGNOSIS — E119 Type 2 diabetes mellitus without complications: Secondary | ICD-10-CM | POA: Diagnosis not present

## 2021-09-09 DIAGNOSIS — Z79899 Other long term (current) drug therapy: Secondary | ICD-10-CM | POA: Diagnosis not present

## 2021-09-15 DIAGNOSIS — F17210 Nicotine dependence, cigarettes, uncomplicated: Secondary | ICD-10-CM | POA: Diagnosis not present

## 2021-09-15 DIAGNOSIS — Z794 Long term (current) use of insulin: Secondary | ICD-10-CM | POA: Diagnosis not present

## 2021-09-15 DIAGNOSIS — E785 Hyperlipidemia, unspecified: Secondary | ICD-10-CM | POA: Diagnosis not present

## 2021-09-15 DIAGNOSIS — E1165 Type 2 diabetes mellitus with hyperglycemia: Secondary | ICD-10-CM | POA: Diagnosis not present

## 2021-09-15 DIAGNOSIS — I10 Essential (primary) hypertension: Secondary | ICD-10-CM | POA: Diagnosis not present

## 2021-09-15 DIAGNOSIS — F332 Major depressive disorder, recurrent severe without psychotic features: Secondary | ICD-10-CM | POA: Diagnosis not present

## 2021-09-15 DIAGNOSIS — R45851 Suicidal ideations: Secondary | ICD-10-CM | POA: Diagnosis not present

## 2021-09-15 DIAGNOSIS — Z6841 Body Mass Index (BMI) 40.0 and over, adult: Secondary | ICD-10-CM | POA: Diagnosis not present

## 2021-09-15 DIAGNOSIS — F419 Anxiety disorder, unspecified: Secondary | ICD-10-CM | POA: Diagnosis not present

## 2021-09-15 DIAGNOSIS — Z79899 Other long term (current) drug therapy: Secondary | ICD-10-CM | POA: Diagnosis not present

## 2021-09-15 DIAGNOSIS — F250 Schizoaffective disorder, bipolar type: Secondary | ICD-10-CM | POA: Diagnosis not present

## 2021-09-15 DIAGNOSIS — E119 Type 2 diabetes mellitus without complications: Secondary | ICD-10-CM | POA: Diagnosis not present

## 2021-09-15 DIAGNOSIS — F315 Bipolar disorder, current episode depressed, severe, with psychotic features: Secondary | ICD-10-CM | POA: Diagnosis not present

## 2021-09-15 DIAGNOSIS — K219 Gastro-esophageal reflux disease without esophagitis: Secondary | ICD-10-CM | POA: Diagnosis not present

## 2021-09-15 DIAGNOSIS — F411 Generalized anxiety disorder: Secondary | ICD-10-CM | POA: Diagnosis not present

## 2021-09-15 DIAGNOSIS — F319 Bipolar disorder, unspecified: Secondary | ICD-10-CM | POA: Diagnosis not present

## 2021-09-26 DIAGNOSIS — F1990 Other psychoactive substance use, unspecified, uncomplicated: Secondary | ICD-10-CM | POA: Diagnosis not present

## 2021-09-26 DIAGNOSIS — F250 Schizoaffective disorder, bipolar type: Secondary | ICD-10-CM | POA: Diagnosis not present

## 2021-10-04 DIAGNOSIS — I10 Essential (primary) hypertension: Secondary | ICD-10-CM | POA: Diagnosis not present

## 2021-10-04 DIAGNOSIS — Z6841 Body Mass Index (BMI) 40.0 and over, adult: Secondary | ICD-10-CM | POA: Diagnosis not present

## 2021-10-04 DIAGNOSIS — E1165 Type 2 diabetes mellitus with hyperglycemia: Secondary | ICD-10-CM | POA: Diagnosis not present

## 2021-10-04 DIAGNOSIS — F319 Bipolar disorder, unspecified: Secondary | ICD-10-CM | POA: Diagnosis not present

## 2021-10-04 DIAGNOSIS — F17200 Nicotine dependence, unspecified, uncomplicated: Secondary | ICD-10-CM | POA: Diagnosis not present

## 2021-10-04 DIAGNOSIS — E785 Hyperlipidemia, unspecified: Secondary | ICD-10-CM | POA: Diagnosis not present

## 2021-10-04 DIAGNOSIS — Z794 Long term (current) use of insulin: Secondary | ICD-10-CM | POA: Diagnosis not present

## 2021-11-07 DIAGNOSIS — G4733 Obstructive sleep apnea (adult) (pediatric): Secondary | ICD-10-CM | POA: Diagnosis not present

## 2021-11-23 DIAGNOSIS — G4733 Obstructive sleep apnea (adult) (pediatric): Secondary | ICD-10-CM | POA: Diagnosis not present

## 2021-12-06 DIAGNOSIS — I10 Essential (primary) hypertension: Secondary | ICD-10-CM | POA: Diagnosis not present

## 2021-12-06 DIAGNOSIS — Z7984 Long term (current) use of oral hypoglycemic drugs: Secondary | ICD-10-CM | POA: Diagnosis not present

## 2021-12-06 DIAGNOSIS — Z79899 Other long term (current) drug therapy: Secondary | ICD-10-CM | POA: Diagnosis not present

## 2021-12-06 DIAGNOSIS — R9431 Abnormal electrocardiogram [ECG] [EKG]: Secondary | ICD-10-CM | POA: Diagnosis not present

## 2021-12-06 DIAGNOSIS — F17210 Nicotine dependence, cigarettes, uncomplicated: Secondary | ICD-10-CM | POA: Diagnosis not present

## 2021-12-06 DIAGNOSIS — Z794 Long term (current) use of insulin: Secondary | ICD-10-CM | POA: Diagnosis not present

## 2021-12-06 DIAGNOSIS — E119 Type 2 diabetes mellitus without complications: Secondary | ICD-10-CM | POA: Diagnosis not present

## 2021-12-06 DIAGNOSIS — I444 Left anterior fascicular block: Secondary | ICD-10-CM | POA: Diagnosis not present

## 2021-12-06 DIAGNOSIS — Z20822 Contact with and (suspected) exposure to covid-19: Secondary | ICD-10-CM | POA: Diagnosis not present

## 2021-12-06 DIAGNOSIS — F32A Depression, unspecified: Secondary | ICD-10-CM | POA: Diagnosis not present

## 2021-12-06 DIAGNOSIS — E785 Hyperlipidemia, unspecified: Secondary | ICD-10-CM | POA: Diagnosis not present

## 2021-12-08 DIAGNOSIS — G4733 Obstructive sleep apnea (adult) (pediatric): Secondary | ICD-10-CM | POA: Diagnosis not present

## 2022-01-05 DIAGNOSIS — G4733 Obstructive sleep apnea (adult) (pediatric): Secondary | ICD-10-CM | POA: Diagnosis not present

## 2022-01-26 DIAGNOSIS — E1159 Type 2 diabetes mellitus with other circulatory complications: Secondary | ICD-10-CM | POA: Diagnosis not present

## 2022-01-26 DIAGNOSIS — Z794 Long term (current) use of insulin: Secondary | ICD-10-CM | POA: Diagnosis not present

## 2022-01-26 DIAGNOSIS — K219 Gastro-esophageal reflux disease without esophagitis: Secondary | ICD-10-CM | POA: Diagnosis not present

## 2022-01-26 DIAGNOSIS — E1169 Type 2 diabetes mellitus with other specified complication: Secondary | ICD-10-CM | POA: Diagnosis not present

## 2022-01-26 DIAGNOSIS — I152 Hypertension secondary to endocrine disorders: Secondary | ICD-10-CM | POA: Diagnosis not present

## 2022-01-26 DIAGNOSIS — E785 Hyperlipidemia, unspecified: Secondary | ICD-10-CM | POA: Diagnosis not present

## 2022-02-05 DIAGNOSIS — G4733 Obstructive sleep apnea (adult) (pediatric): Secondary | ICD-10-CM | POA: Diagnosis not present

## 2022-02-28 DIAGNOSIS — G4733 Obstructive sleep apnea (adult) (pediatric): Secondary | ICD-10-CM | POA: Diagnosis not present

## 2022-02-28 DIAGNOSIS — Z794 Long term (current) use of insulin: Secondary | ICD-10-CM | POA: Diagnosis not present

## 2022-02-28 DIAGNOSIS — E1169 Type 2 diabetes mellitus with other specified complication: Secondary | ICD-10-CM | POA: Diagnosis not present

## 2022-02-28 DIAGNOSIS — E1159 Type 2 diabetes mellitus with other circulatory complications: Secondary | ICD-10-CM | POA: Diagnosis not present

## 2022-02-28 DIAGNOSIS — Z0000 Encounter for general adult medical examination without abnormal findings: Secondary | ICD-10-CM | POA: Diagnosis not present

## 2022-02-28 DIAGNOSIS — E785 Hyperlipidemia, unspecified: Secondary | ICD-10-CM | POA: Diagnosis not present

## 2022-02-28 DIAGNOSIS — I152 Hypertension secondary to endocrine disorders: Secondary | ICD-10-CM | POA: Diagnosis not present

## 2022-02-28 DIAGNOSIS — L600 Ingrowing nail: Secondary | ICD-10-CM | POA: Diagnosis not present

## 2022-02-28 DIAGNOSIS — E119 Type 2 diabetes mellitus without complications: Secondary | ICD-10-CM | POA: Diagnosis not present

## 2022-03-07 DIAGNOSIS — G4733 Obstructive sleep apnea (adult) (pediatric): Secondary | ICD-10-CM | POA: Diagnosis not present

## 2022-03-16 DIAGNOSIS — Z20822 Contact with and (suspected) exposure to covid-19: Secondary | ICD-10-CM | POA: Diagnosis not present

## 2022-03-16 DIAGNOSIS — R7989 Other specified abnormal findings of blood chemistry: Secondary | ICD-10-CM | POA: Diagnosis not present

## 2022-04-07 DIAGNOSIS — G4733 Obstructive sleep apnea (adult) (pediatric): Secondary | ICD-10-CM | POA: Diagnosis not present

## 2022-04-10 DIAGNOSIS — I152 Hypertension secondary to endocrine disorders: Secondary | ICD-10-CM | POA: Diagnosis not present

## 2022-04-10 DIAGNOSIS — L600 Ingrowing nail: Secondary | ICD-10-CM | POA: Diagnosis not present

## 2022-04-10 DIAGNOSIS — F17200 Nicotine dependence, unspecified, uncomplicated: Secondary | ICD-10-CM | POA: Diagnosis not present

## 2022-04-10 DIAGNOSIS — E119 Type 2 diabetes mellitus without complications: Secondary | ICD-10-CM | POA: Diagnosis not present

## 2022-04-10 DIAGNOSIS — E1159 Type 2 diabetes mellitus with other circulatory complications: Secondary | ICD-10-CM | POA: Diagnosis not present

## 2022-04-10 DIAGNOSIS — Z794 Long term (current) use of insulin: Secondary | ICD-10-CM | POA: Diagnosis not present

## 2022-04-10 DIAGNOSIS — R600 Localized edema: Secondary | ICD-10-CM | POA: Diagnosis not present

## 2022-04-11 DIAGNOSIS — E1165 Type 2 diabetes mellitus with hyperglycemia: Secondary | ICD-10-CM | POA: Diagnosis not present

## 2022-04-11 DIAGNOSIS — E1159 Type 2 diabetes mellitus with other circulatory complications: Secondary | ICD-10-CM | POA: Diagnosis not present

## 2022-04-11 DIAGNOSIS — Z794 Long term (current) use of insulin: Secondary | ICD-10-CM | POA: Diagnosis not present

## 2022-04-11 DIAGNOSIS — I152 Hypertension secondary to endocrine disorders: Secondary | ICD-10-CM | POA: Diagnosis not present

## 2022-05-03 DIAGNOSIS — Z79899 Other long term (current) drug therapy: Secondary | ICD-10-CM | POA: Diagnosis not present

## 2022-05-03 DIAGNOSIS — Z743 Need for continuous supervision: Secondary | ICD-10-CM | POA: Diagnosis not present

## 2022-05-03 DIAGNOSIS — E119 Type 2 diabetes mellitus without complications: Secondary | ICD-10-CM | POA: Diagnosis not present

## 2022-05-03 DIAGNOSIS — R9082 White matter disease, unspecified: Secondary | ICD-10-CM | POA: Diagnosis not present

## 2022-05-03 DIAGNOSIS — E785 Hyperlipidemia, unspecified: Secondary | ICD-10-CM | POA: Diagnosis not present

## 2022-05-03 DIAGNOSIS — R112 Nausea with vomiting, unspecified: Secondary | ICD-10-CM | POA: Diagnosis not present

## 2022-05-03 DIAGNOSIS — Z7982 Long term (current) use of aspirin: Secondary | ICD-10-CM | POA: Diagnosis not present

## 2022-05-03 DIAGNOSIS — R519 Headache, unspecified: Secondary | ICD-10-CM | POA: Diagnosis not present

## 2022-05-03 DIAGNOSIS — R52 Pain, unspecified: Secondary | ICD-10-CM | POA: Diagnosis not present

## 2022-05-03 DIAGNOSIS — H53149 Visual discomfort, unspecified: Secondary | ICD-10-CM | POA: Diagnosis not present

## 2022-05-03 DIAGNOSIS — G4489 Other headache syndrome: Secondary | ICD-10-CM | POA: Diagnosis not present

## 2022-05-03 DIAGNOSIS — F17210 Nicotine dependence, cigarettes, uncomplicated: Secondary | ICD-10-CM | POA: Diagnosis not present

## 2022-05-03 DIAGNOSIS — Z794 Long term (current) use of insulin: Secondary | ICD-10-CM | POA: Diagnosis not present

## 2022-05-03 DIAGNOSIS — R638 Other symptoms and signs concerning food and fluid intake: Secondary | ICD-10-CM | POA: Diagnosis not present

## 2022-05-03 DIAGNOSIS — I10 Essential (primary) hypertension: Secondary | ICD-10-CM | POA: Diagnosis not present

## 2022-05-03 DIAGNOSIS — G319 Degenerative disease of nervous system, unspecified: Secondary | ICD-10-CM | POA: Diagnosis not present

## 2022-05-03 DIAGNOSIS — R6889 Other general symptoms and signs: Secondary | ICD-10-CM | POA: Diagnosis not present

## 2022-05-07 DIAGNOSIS — G4733 Obstructive sleep apnea (adult) (pediatric): Secondary | ICD-10-CM | POA: Diagnosis not present

## 2022-05-22 DIAGNOSIS — Z5900 Homelessness unspecified: Secondary | ICD-10-CM | POA: Diagnosis not present

## 2022-05-22 DIAGNOSIS — I10 Essential (primary) hypertension: Secondary | ICD-10-CM | POA: Diagnosis not present

## 2022-05-22 DIAGNOSIS — I444 Left anterior fascicular block: Secondary | ICD-10-CM | POA: Diagnosis not present

## 2022-05-22 DIAGNOSIS — E119 Type 2 diabetes mellitus without complications: Secondary | ICD-10-CM | POA: Diagnosis not present

## 2022-05-22 DIAGNOSIS — Z91148 Patient's other noncompliance with medication regimen for other reason: Secondary | ICD-10-CM | POA: Diagnosis not present

## 2022-05-22 DIAGNOSIS — R9431 Abnormal electrocardiogram [ECG] [EKG]: Secondary | ICD-10-CM | POA: Diagnosis not present

## 2022-05-23 DIAGNOSIS — I10 Essential (primary) hypertension: Secondary | ICD-10-CM | POA: Diagnosis not present

## 2022-05-23 DIAGNOSIS — K219 Gastro-esophageal reflux disease without esophagitis: Secondary | ICD-10-CM | POA: Diagnosis not present

## 2022-05-23 DIAGNOSIS — E785 Hyperlipidemia, unspecified: Secondary | ICD-10-CM | POA: Diagnosis not present

## 2022-05-23 DIAGNOSIS — E119 Type 2 diabetes mellitus without complications: Secondary | ICD-10-CM | POA: Diagnosis not present

## 2022-05-24 DIAGNOSIS — E119 Type 2 diabetes mellitus without complications: Secondary | ICD-10-CM | POA: Diagnosis not present

## 2022-05-24 DIAGNOSIS — I10 Essential (primary) hypertension: Secondary | ICD-10-CM | POA: Diagnosis not present

## 2022-05-24 DIAGNOSIS — E785 Hyperlipidemia, unspecified: Secondary | ICD-10-CM | POA: Diagnosis not present

## 2022-05-24 DIAGNOSIS — K219 Gastro-esophageal reflux disease without esophagitis: Secondary | ICD-10-CM | POA: Diagnosis not present

## 2022-05-25 DIAGNOSIS — E785 Hyperlipidemia, unspecified: Secondary | ICD-10-CM | POA: Diagnosis not present

## 2022-05-25 DIAGNOSIS — E119 Type 2 diabetes mellitus without complications: Secondary | ICD-10-CM | POA: Diagnosis not present

## 2022-05-25 DIAGNOSIS — K219 Gastro-esophageal reflux disease without esophagitis: Secondary | ICD-10-CM | POA: Diagnosis not present

## 2022-05-25 DIAGNOSIS — I10 Essential (primary) hypertension: Secondary | ICD-10-CM | POA: Diagnosis not present

## 2022-05-26 DIAGNOSIS — I10 Essential (primary) hypertension: Secondary | ICD-10-CM | POA: Diagnosis not present

## 2022-05-26 DIAGNOSIS — K219 Gastro-esophageal reflux disease without esophagitis: Secondary | ICD-10-CM | POA: Diagnosis not present

## 2022-05-26 DIAGNOSIS — E119 Type 2 diabetes mellitus without complications: Secondary | ICD-10-CM | POA: Diagnosis not present

## 2022-05-26 DIAGNOSIS — E785 Hyperlipidemia, unspecified: Secondary | ICD-10-CM | POA: Diagnosis not present

## 2022-05-27 DIAGNOSIS — E119 Type 2 diabetes mellitus without complications: Secondary | ICD-10-CM | POA: Diagnosis not present

## 2022-05-27 DIAGNOSIS — E785 Hyperlipidemia, unspecified: Secondary | ICD-10-CM | POA: Diagnosis not present

## 2022-05-27 DIAGNOSIS — K219 Gastro-esophageal reflux disease without esophagitis: Secondary | ICD-10-CM | POA: Diagnosis not present

## 2022-05-27 DIAGNOSIS — I10 Essential (primary) hypertension: Secondary | ICD-10-CM | POA: Diagnosis not present

## 2022-05-28 DIAGNOSIS — E119 Type 2 diabetes mellitus without complications: Secondary | ICD-10-CM | POA: Diagnosis not present

## 2022-05-28 DIAGNOSIS — I10 Essential (primary) hypertension: Secondary | ICD-10-CM | POA: Diagnosis not present

## 2022-05-28 DIAGNOSIS — K219 Gastro-esophageal reflux disease without esophagitis: Secondary | ICD-10-CM | POA: Diagnosis not present

## 2022-05-28 DIAGNOSIS — E785 Hyperlipidemia, unspecified: Secondary | ICD-10-CM | POA: Diagnosis not present

## 2022-05-29 DIAGNOSIS — E119 Type 2 diabetes mellitus without complications: Secondary | ICD-10-CM | POA: Diagnosis not present

## 2022-05-29 DIAGNOSIS — E785 Hyperlipidemia, unspecified: Secondary | ICD-10-CM | POA: Diagnosis not present

## 2022-05-29 DIAGNOSIS — I10 Essential (primary) hypertension: Secondary | ICD-10-CM | POA: Diagnosis not present

## 2022-05-29 DIAGNOSIS — K219 Gastro-esophageal reflux disease without esophagitis: Secondary | ICD-10-CM | POA: Diagnosis not present

## 2022-06-07 DIAGNOSIS — G4733 Obstructive sleep apnea (adult) (pediatric): Secondary | ICD-10-CM | POA: Diagnosis not present
